# Patient Record
Sex: Female | Born: 1985
Health system: Southern US, Community
[De-identification: ages and names within clinical notes are randomized; demographics above are authoritative.]

## PROBLEM LIST (undated history)

## (undated) ENCOUNTER — Inpatient Hospital Stay (HOSPITAL_COMMUNITY): Payer: Self-pay

---

## 2012-08-07 ENCOUNTER — Encounter (HOSPITAL_BASED_OUTPATIENT_CLINIC_OR_DEPARTMENT_OTHER): Payer: Self-pay | Admitting: Emergency Medicine

## 2012-08-07 ENCOUNTER — Emergency Department (HOSPITAL_BASED_OUTPATIENT_CLINIC_OR_DEPARTMENT_OTHER)
Admission: EM | Admit: 2012-08-07 | Discharge: 2012-08-07 | Disposition: A | Discharge status: Home / Self Care | Payer: Medicaid Other | Attending: Emergency Medicine | Admitting: Emergency Medicine

## 2012-08-07 DIAGNOSIS — R42 Dizziness and giddiness: Secondary | ICD-10-CM | POA: Insufficient documentation

## 2012-08-07 DIAGNOSIS — N390 Urinary tract infection, site not specified: Secondary | ICD-10-CM | POA: Insufficient documentation

## 2012-08-07 DIAGNOSIS — F17200 Nicotine dependence, unspecified, uncomplicated: Secondary | ICD-10-CM | POA: Insufficient documentation

## 2012-08-07 LAB — URINALYSIS, ROUTINE W REFLEX MICROSCOPIC
Glucose, UA: NEGATIVE mg/dL
Hgb urine dipstick: NEGATIVE
Ketones, ur: NEGATIVE mg/dL
Protein, ur: 30 mg/dL — AB
Urobilinogen, UA: 1 mg/dL (ref 0.0–1.0)

## 2012-08-07 LAB — URINE MICROSCOPIC-ADD ON

## 2012-08-07 MED ORDER — HYDROCODONE-ACETAMINOPHEN 5-500 MG PO TABS: 1.0000 | ORAL_TABLET | ORAL | Status: DC | PRN

## 2012-08-07 MED ORDER — NITROFURANTOIN MONOHYD MACRO 100 MG PO CAPS
100.0000 mg | ORAL_CAPSULE | Freq: Once | ORAL | Status: AC
  Administered 2012-08-07: 100 mg via ORAL
  Filled 2012-08-07: qty 1

## 2012-08-07 MED ORDER — NITROFURANTOIN MONOHYD MACRO 100 MG PO CAPS: 100.0000 mg | ORAL_CAPSULE | Freq: Two times a day (BID) | ORAL | Status: DC

## 2012-08-07 MED ORDER — HYDROCODONE-ACETAMINOPHEN 5-325 MG PO TABS
2.0000 | ORAL_TABLET | Freq: Once | ORAL | Status: AC
  Administered 2012-08-07: 2 via ORAL
  Filled 2012-08-07: qty 2

## 2012-08-07 MED ORDER — IBUPROFEN 800 MG PO TABS
800.0000 mg | ORAL_TABLET | Freq: Once | ORAL | Status: AC
  Administered 2012-08-07: 800 mg via ORAL
  Filled 2012-08-07: qty 1

## 2012-08-07 MED ORDER — ONDANSETRON HCL 8 MG PO TABS
4.0000 mg | ORAL_TABLET | Freq: Once | ORAL | Status: AC
  Administered 2012-08-07: 4 mg via ORAL
  Filled 2012-08-07: qty 1

## 2012-08-07 NOTE — ED Provider Notes (Signed)
History     CSN: [id number]  Arrival date & time 08/07/12  4098   First MD Initiated Contact with Patient 08/07/12 2036      Chief Complaint  Patient presents with  . Abdominal Pain  . Dysuria    (Consider location/radiation/quality/duration/timing/severity/associated sxs/prior treatment) Patient is a 26 y.o. female presenting with abdominal pain and dysuria. The history is provided by the patient.  Abdominal Pain The primary symptoms of the illness include abdominal pain and dysuria. The primary symptoms of the illness do not include shortness of breath. The current episode started 2 days ago. The onset of the illness was gradual. The problem has been gradually worsening.  The dysuria is not associated with hematuria or frequency.  The patient states that she believes she is currently not pregnant. Symptoms associated with the illness do not include chills, hematuria, frequency or back pain. Associated medical issues comments: dizziness.  Dysuria  Pertinent negatives include no chills, no frequency and no hematuria.    History reviewed. No pertinent past medical history.  History reviewed. No pertinent past surgical history.  No family history on file.  History  Substance Use Topics  . Smoking status: Current Every Day Smoker -- 0.2 packs/day  . Smokeless tobacco: Not on file  . Alcohol Use: No    OB History    Grav Para Term Preterm Abortions TAB SAB Ect Mult Living                  Review of Systems  Constitutional: Negative for chills and activity change.       All ROS Neg except as noted in HPI  HENT: Negative for nosebleeds and neck pain.   Eyes: Negative for photophobia and discharge.  Respiratory: Negative for cough, shortness of breath and wheezing.   Cardiovascular: Negative for chest pain and palpitations.  Gastrointestinal: Positive for abdominal pain. Negative for blood in stool.  Genitourinary: Positive for dysuria. Negative for frequency and  hematuria.  Musculoskeletal: Negative for back pain and arthralgias.  Skin: Negative.   Neurological: Positive for dizziness. Negative for seizures and speech difficulty.  Psychiatric/Behavioral: Negative for hallucinations and confusion.    Allergies  Review of patient's allergies indicates no known allergies.  Home Medications  No current outpatient prescriptions on file.  BP 131/79  Pulse 92  Temp 97.9 F (36.6 C) (Oral)  Resp 20  Ht 5' (1.524 m)  Wt 115 lb (52.164 kg)  BMI 22.46 kg/m2  SpO2 100%  LMP 07/12/2012  Physical Exam  Nursing note and vitals reviewed. Constitutional: She is oriented to person, place, and time. She appears well-developed and well-nourished.  Non-toxic appearance.  HENT:  Head: Normocephalic.  Right Ear: Tympanic membrane and external ear normal.  Left Ear: Tympanic membrane and external ear normal.  Eyes: EOM and lids are normal. Pupils are equal, round, and reactive to light.  Neck: Normal range of motion. Neck supple. Carotid bruit is not present.  Cardiovascular: Normal rate, regular rhythm, normal heart sounds, intact distal pulses and normal pulses.   Pulmonary/Chest: Breath sounds normal. No respiratory distress.  Abdominal: Soft. Bowel sounds are normal. There is no guarding.       Lower abd pain to palpation. No CVAT.   Musculoskeletal: Normal range of motion.  Lymphadenopathy:       Head (right side): No submandibular adenopathy present.       Head (left side): No submandibular adenopathy present.    She has no cervical adenopathy.  Neurological: She  is alert and oriented to person, place, and time. She has normal strength. No cranial nerve deficit or sensory deficit.  Skin: Skin is warm and dry.  Psychiatric: She has a normal mood and affect. Her speech is normal.    ED Course  Procedures (including critical care time)  Labs Reviewed  URINALYSIS, ROUTINE W REFLEX MICROSCOPIC - Abnormal; Notable for the following:    APPearance  CLOUDY (*)     Protein, ur 30 (*)     Nitrite POSITIVE (*)     Leukocytes, UA LARGE (*)     All other components within normal limits  URINE MICROSCOPIC-ADD ON - Abnormal; Notable for the following:    Squamous Epithelial / LPF FEW (*)     Bacteria, UA MANY (*)     All other components within normal limits  PREGNANCY, URINE   No results found.   No diagnosis found.    MDM  I have reviewed nursing notes, vital signs, and all appropriate lab and imaging results for this patient. Urine pregnancy test is negative. Urine analysis reveals too many to count white blood cells, many bacteria, positive nitrates, positive leukocyte esterase. Culture sent to the lab. Patient placed on Macrobid 2 times daily. Prescription for Norco one every 4 hours given to the patient. Your in the wounds to the patient. Patient is to see her primary physician concerning a discharge that she has had for some time. Patient advised to return to the emergency department if any changes, problems, or concerns.       Kathie Dike, [place] 08/07/12 2117

## 2012-08-07 NOTE — ED Notes (Addendum)
Suprapubic pain and dysuria x2 days. Vaginal discharge clear/yellow.

## 2012-08-07 NOTE — ED Provider Notes (Signed)
Medical screening examination/treatment/procedure(s) were performed by non-physician practitioner and as supervising physician I was immediately available for consultation/collaboration.   Celene Kras, MD 08/07/12 2118

## 2012-08-09 LAB — URINE CULTURE: Colony Count: >=100000

## 2012-08-10 NOTE — ED Notes (Signed)
+   Urine Patient treated with macrobid-sensitive to same-chart appended per protocol MD. 

## 2014-04-21 ENCOUNTER — Emergency Department (HOSPITAL_COMMUNITY)
Admission: AD | Admit: 2014-04-21 | Discharge: 2014-04-21 | Disposition: A | Discharge status: Home / Self Care | Payer: Medicaid Other | Source: Ambulatory Visit | Attending: Obstetrics & Gynecology | Admitting: Obstetrics & Gynecology

## 2014-04-21 ENCOUNTER — Encounter (HOSPITAL_COMMUNITY): Payer: Self-pay | Admitting: Emergency Medicine

## 2014-04-21 ENCOUNTER — Encounter (HOSPITAL_COMMUNITY): Payer: Self-pay | Admitting: *Deleted

## 2014-04-21 ENCOUNTER — Inpatient Hospital Stay (HOSPITAL_COMMUNITY): Payer: Medicaid Other

## 2014-04-21 ENCOUNTER — Inpatient Hospital Stay (EMERGENCY_DEPARTMENT_HOSPITAL)
Admission: AD | Admit: 2014-04-21 | Discharge: 2014-04-21 | Disposition: A | Discharge status: Home / Self Care | Payer: Medicaid Other | Source: Ambulatory Visit | Attending: Obstetrics & Gynecology | Admitting: Obstetrics & Gynecology

## 2014-04-21 DIAGNOSIS — R109 Unspecified abdominal pain: Secondary | ICD-10-CM | POA: Insufficient documentation

## 2014-04-21 DIAGNOSIS — O209 Hemorrhage in early pregnancy, unspecified: Secondary | ICD-10-CM

## 2014-04-21 DIAGNOSIS — F17200 Nicotine dependence, unspecified, uncomplicated: Secondary | ICD-10-CM | POA: Insufficient documentation

## 2014-04-21 DIAGNOSIS — Z3200 Encounter for pregnancy test, result unknown: Secondary | ICD-10-CM | POA: Insufficient documentation

## 2014-04-21 DIAGNOSIS — O2341 Unspecified infection of urinary tract in pregnancy, first trimester: Secondary | ICD-10-CM

## 2014-04-21 DIAGNOSIS — N898 Other specified noninflammatory disorders of vagina: Secondary | ICD-10-CM | POA: Insufficient documentation

## 2014-04-21 LAB — URINALYSIS, ROUTINE W REFLEX MICROSCOPIC
Bilirubin Urine: NEGATIVE
Glucose, UA: NEGATIVE mg/dL
Ketones, ur: NEGATIVE mg/dL
LEUKOCYTES UA: NEGATIVE
NITRITE: POSITIVE — AB
PH: 6 (ref 5.0–8.0)
Protein, ur: NEGATIVE mg/dL
SPECIFIC GRAVITY, URINE: 1.02 (ref 1.005–1.030)
Urobilinogen, UA: 0.2 mg/dL (ref 0.0–1.0)

## 2014-04-21 LAB — URINE MICROSCOPIC-ADD ON

## 2014-04-21 LAB — CBC
HCT: 34 % — ABNORMAL LOW (ref 36.0–46.0)
HEMOGLOBIN: 11.6 g/dL — AB (ref 12.0–15.0)
MCH: 31.5 pg (ref 26.0–34.0)
MCHC: 34.1 g/dL (ref 30.0–36.0)
MCV: 92.4 fL (ref 78.0–100.0)
Platelets: 173 10*3/uL (ref 150–400)
RBC: 3.68 MIL/uL — AB (ref 3.87–5.11)
RDW: 12.4 % (ref 11.5–15.5)
WBC: 7.7 10*3/uL (ref 4.0–10.5)

## 2014-04-21 LAB — WET PREP, GENITAL
TRICH WET PREP: NONE SEEN
Yeast Wet Prep HPF POC: NONE SEEN

## 2014-04-21 LAB — ABO/RH: ABO/RH(D): O POS

## 2014-04-21 LAB — HCG, QUANTITATIVE, PREGNANCY: HCG, BETA CHAIN, QUANT, S: 258 m[IU]/mL — AB (ref <5)

## 2014-04-21 LAB — POCT PREGNANCY, URINE: Preg Test, Ur: POSITIVE — AB

## 2014-04-21 MED ORDER — SULFAMETHOXAZOLE-TMP DS 800-160 MG PO TABS: 1.0000 | ORAL_TABLET | Freq: Two times a day (BID) | ORAL | Status: DC

## 2014-04-21 NOTE — MAU Provider Note (Signed)
History     CSN: [id number]  Arrival date and time: 04/21/14 1738   First Provider Initiated Contact with Patient 04/21/14 1908      Chief Complaint  Patient presents with  . Possible Pregnancy  . Abdominal Cramping  . Vaginal Bleeding   HPI Tanya Perez is a 28 y.o. G3P1001 at [redacted]w[redacted]d by unsure LMP who presents to MAU today with complaint of heavy bleeding since last night. The patient is also having cramping in the lower abdomen rated at 8/10. She denies vaginal discharge, UTI symptoms, fever, N/V/D or constipation. She states that she has used ~ 4 pads today. She denies weakness or dizziness.   OB History   Grav Para Term Preterm Abortions TAB SAB Ect Mult Living   3 1 1       1       History reviewed. No pertinent past medical history.  History reviewed. No pertinent past surgical history.  History reviewed. No pertinent family history.  History  Substance Use Topics  . Smoking status: Current Every Day Smoker -- 0.20 packs/day  . Smokeless tobacco: Never Used  . Alcohol Use: No    Allergies: No Known Allergies  No prescriptions prior to admission    Review of Systems  Constitutional: Negative for fever and malaise/fatigue.  Gastrointestinal: Positive for abdominal pain. Negative for nausea, vomiting, diarrhea and constipation.  Genitourinary: Negative for dysuria, urgency and frequency.       + vaginal bleeding Neg - vaginal discharge  Neurological: Negative for dizziness, loss of consciousness and weakness.   Physical Exam   Blood pressure 115/54, pulse 76, temperature 99.1 F (37.3 C), temperature source Oral, resp. rate 16, height 5' (1.524 m), weight 116 lb 6.4 oz (52.799 kg), last menstrual period 03/05/2014, SpO2 99.00%.  Physical Exam  Constitutional: She is oriented to person, place, and time. She appears well-developed and well-nourished. No distress.  HENT:  Head: Normocephalic and atraumatic.  Cardiovascular: Normal rate.   Respiratory:  Effort normal.  GI: Soft. Bowel sounds are normal. She exhibits no distension and no mass. There is tenderness (mild to moderate tenderness to palpation of the lower abdomen at midline). There is guarding. There is no rebound.  Genitourinary: Uterus is enlarged (mild). Uterus is not tender. Cervix exhibits no motion tenderness, no discharge and no friability. Right adnexum displays tenderness. Right adnexum displays no mass. Left adnexum displays tenderness. Left adnexum displays no mass. There is bleeding (small amount of blood in the vaginal vault) around the vagina. No vaginal discharge found.  Cervix: closed, thick  Neurological: She is alert and oriented to person, place, and time.  Skin: Skin is warm and dry. No erythema.  Psychiatric: She has a normal mood and affect.   Results for orders placed during the hospital encounter of 04/21/14 (from the past 24 hour(s))  CBC     Status: Abnormal   Collection Time    04/21/14  6:13 PM      Result Value Ref Range   WBC 7.7  4.0 - 10.5 K/uL   RBC 3.68 (*) 3.87 - 5.11 MIL/uL   Hemoglobin 11.6 (*) 12.0 - 15.0 g/dL   HCT 78.2 (*) 95.6 - 21.3 %   MCV 92.4  78.0 - 100.0 fL   MCH 31.5  26.0 - 34.0 pg   MCHC 34.1  30.0 - 36.0 g/dL   RDW 08.6  57.8 - 46.9 %   Platelets 173  150 - 400 K/uL  ABO/RH  Status: None   Collection Time    04/21/14  6:13 PM      Result Value Ref Range   ABO/RH(D) O POS    HCG, QUANTITATIVE, PREGNANCY     Status: Abnormal   Collection Time    04/21/14  6:13 PM      Result Value Ref Range   hCG, Beta Chain, Quant, S 258 (*) <5 mIU/mL  URINALYSIS, ROUTINE W REFLEX MICROSCOPIC     Status: Abnormal   Collection Time    04/21/14  6:19 PM      Result Value Ref Range   Color, Urine YELLOW  YELLOW   APPearance CLEAR  CLEAR   Specific Gravity, Urine 1.020  1.005 - 1.030   pH 6.0  5.0 - 8.0   Glucose, UA NEGATIVE  NEGATIVE mg/dL   Hgb urine dipstick LARGE (*) NEGATIVE   Bilirubin Urine NEGATIVE  NEGATIVE   Ketones, ur  NEGATIVE  NEGATIVE mg/dL   Protein, ur NEGATIVE  NEGATIVE mg/dL   Urobilinogen, UA 0.2  0.0 - 1.0 mg/dL   Nitrite POSITIVE (*) NEGATIVE   Leukocytes, UA NEGATIVE  NEGATIVE  URINE MICROSCOPIC-ADD ON     Status: Abnormal   Collection Time    04/21/14  6:19 PM      Result Value Ref Range   Squamous Epithelial / LPF FEW (*) RARE   WBC, UA 3-6  <3 WBC/hpf   RBC / HPF 0-2  <3 RBC/hpf   Bacteria, UA MANY (*) RARE  POCT PREGNANCY, URINE     Status: Abnormal   Collection Time    04/21/14  6:21 PM      Result Value Ref Range   Preg Test, Ur POSITIVE (*) NEGATIVE  WET PREP, GENITAL     Status: Abnormal   Collection Time    04/21/14  7:18 PM      Result Value Ref Range   Yeast Wet Prep HPF POC NONE SEEN  NONE SEEN   Trich, Wet Prep NONE SEEN  NONE SEEN   Clue Cells Wet Prep HPF POC FEW (*) NONE SEEN   WBC, Wet Prep HPF POC FEW (*) NONE SEEN    MAU Course  Procedures None  MDM +UPT UA, wet prep, GC/chlamydia, CBC, ABO/Rh, quant hCG and US today 2000 - patient in US. Care turned over to Wynelle BourgeoisMarie Williams, CNM  Freddi StarrJulie N Ethier, PA-C  04/21/2014, 7:56 PM  Assessment and Plan   US Ob Transvaginal  04/21/2014   CLINICAL DATA:  Positive pregnancy test, pelvic pain, vaginal bleeding  EXAM: OBSTETRIC <14 WK US AND TRANSVAGINAL OB US  TECHNIQUE: Both transabdominal and transvaginal ultrasound examinations were performed for complete evaluation of the gestation as well as the maternal uterus, adnexal regions, and pelvic cul-de-sac. Transvaginal technique was performed to assess early pregnancy.  COMPARISON:  None.    FINDINGS: Intrauterine gestational sac: Visualized, mildly irregular in shape, within the lower uterine segment endometrial canal  Yolk sac:  Not visualized  Embryo:  Not visualized  Cardiac Activity: No embryo seen  MSD: 5 mm   5 w   0  d        US EDC: 12/22/2014  Maternal uterus/adnexae: The right ovary is normal. Within the left adnexa/ovary, there is a hypoechoic oval structure with low  level internal echoes measuring 3.4 x 3.3 x 2.6 cm  .  IMPRESSION: Intrauterine gestational sac, within the lower uterine segment endometrial canal, suspicious for abortion in progress given the presence of  vaginal bleeding at the time of the exam. No yolk sac, fetal pole, or cardiac activity yet visualized. Depending on the clinical symptoms, consider imaging followup in 10- 14 days to document appropriate pregnancy progression.  Hypoechoic left ovarian mass with an appearance most typical for an endometrioma. Hemorrhagic cyst may appear similar.   Followup pelvic ultrasound is recommended in 4-6 weeks for further evaluation.     Electronically Signed   By: Christiana PellantGretchen  Green M.D.   On: 04/21/2014 20:39   Discussed findings with pt.  Was planning an elective AB this week Will repeat Quant in 2 days in clinic. May repeat US in 7-10 days. Aviva SignsMarie L Williams, CNM

## 2014-04-21 NOTE — ED Notes (Signed)
Pt c/o abd cramps x 1 day and states she started bleeding last night. Pt states she took a pregnancy test and it was positive.

## 2014-04-21 NOTE — MAU Note (Signed)
Patient states she has had a positive home pregnancy test. States she has been bleeding and having abdominal cramping. Denies nausea or vomiting.

## 2014-04-21 NOTE — Discharge Instructions (Signed)

## 2014-04-21 NOTE — ED Notes (Signed)
Pt left w/o notifying staff.  Department Secretary received a call that [facility] is [facility].

## 2014-04-21 NOTE — MAU Provider Note (Signed)
Attestation of Attending Supervision of Advanced Practitioner (CNM/NP): Evaluation and management procedures were performed by the Advanced Practitioner under my supervision and collaboration.  I have reviewed the Advanced Practitioner's note and chart, and I agree with the management and plan.  HARRAWAY-SMITH, Kaleena Corrow 8:50 PM

## 2014-04-21 NOTE — ED Notes (Addendum)
Called pt x2 no answer

## 2014-04-22 LAB — GC/CHLAMYDIA PROBE AMP
CT PROBE, AMP APTIMA: NEGATIVE
GC PROBE AMP APTIMA: NEGATIVE

## 2014-04-23 ENCOUNTER — Other Ambulatory Visit: Payer: Medicaid Other

## 2014-04-23 ENCOUNTER — Telehealth: Payer: Self-pay | Admitting: *Deleted

## 2014-04-23 DIAGNOSIS — N939 Abnormal uterine and vaginal bleeding, unspecified: Secondary | ICD-10-CM

## 2014-04-23 LAB — HCG, QUANTITATIVE, PREGNANCY: hCG, Beta Chain, Quant, S: 205.7 m[IU]/mL

## 2014-04-23 NOTE — Telephone Encounter (Signed)
Talked with patient concerning results and scheduling an appointment next Wednesday for BHCG.  Educated patient on sign/symptoms to come to MAU.  Pt verbalizes understanding.

## 2014-04-23 NOTE — Telephone Encounter (Signed)
Tanya Perez called to get the results of her bhcg drawn this am. Called her and informed her results are not yet available- we will call her once we receive the results.

## 2014-04-30 ENCOUNTER — Other Ambulatory Visit: Payer: Medicaid Other

## 2014-05-05 ENCOUNTER — Inpatient Hospital Stay (HOSPITAL_COMMUNITY)
Admission: AD | Admit: 2014-05-05 | Discharge: 2014-05-05 | Disposition: A | Discharge status: Home / Self Care | Payer: Medicaid Other | Source: Ambulatory Visit | Attending: Obstetrics & Gynecology | Admitting: Obstetrics & Gynecology

## 2014-05-05 DIAGNOSIS — O039 Complete or unspecified spontaneous abortion without complication: Secondary | ICD-10-CM

## 2014-05-05 DIAGNOSIS — O035 Genital tract and pelvic infection following complete or unspecified spontaneous abortion: Secondary | ICD-10-CM | POA: Insufficient documentation

## 2014-05-05 DIAGNOSIS — N390 Urinary tract infection, site not specified: Secondary | ICD-10-CM

## 2014-05-05 LAB — URINALYSIS, ROUTINE W REFLEX MICROSCOPIC
Bilirubin Urine: NEGATIVE
Glucose, UA: NEGATIVE mg/dL
HGB URINE DIPSTICK: NEGATIVE
KETONES UR: NEGATIVE mg/dL
Leukocytes, UA: NEGATIVE
Nitrite: POSITIVE — AB
PROTEIN: NEGATIVE mg/dL
Specific Gravity, Urine: 1.02 (ref 1.005–1.030)
UROBILINOGEN UA: 1 mg/dL (ref 0.0–1.0)
pH: 7 (ref 5.0–8.0)

## 2014-05-05 LAB — URINE MICROSCOPIC-ADD ON

## 2014-05-05 LAB — HCG, QUANTITATIVE, PREGNANCY: HCG, BETA CHAIN, QUANT, S: 1 m[IU]/mL (ref <5)

## 2014-05-05 NOTE — MAU Provider Note (Signed)
History   Chief Complaint:  Follow-up   Tanya Perez is  28 y.o. G3P1001 Patient's last menstrual period was 03/05/2014.Marland Kitchen. Patient is here for follow up of quantitative HCG and ongoing surveillance of pregnancy status.   She is 5824w5d weeks gestation  by LMP w/ dropping quants. IUP not verified.   Missed clinic appointment to check quant. Denied pain and bleeding. Bleedin gstopped 1 week ago. Did not take ABX for UTI. No Sx.   Since her last visit, the patient is without new complaint.   The patient reports bleeding as  none now.   General ROS:  negative  Her previous Quantitative HCG values are: Results for Tanya LynchLANE, Lacrisha (MRN [id number]) as of 05/06/2014 15:03  Ref. Range 04/21/2014 18:13 04/23/2014 11:15  hCG, Beta Chain, Quant, S Latest Range: <5 mIU/mL 258 (H) 205.7     Physical Exam   Blood pressure 105/72, pulse 85, temperature 98.5 F (36.9 C), temperature source Oral, resp. rate 16, last menstrual period 03/05/2014.  Focused Gynecological Exam: examination not indicated  Labs: Results for orders placed during the hospital encounter of 05/05/14 (from the past 48 hour(s))  URINALYSIS, ROUTINE W REFLEX MICROSCOPIC     Status: Abnormal   Collection Time    05/05/14  1:34 PM      Result Value Ref Range   Color, Urine YELLOW  YELLOW   APPearance HAZY (*) CLEAR   Specific Gravity, Urine 1.020  1.005 - 1.030   pH 7.0  5.0 - 8.0   Glucose, UA NEGATIVE  NEGATIVE mg/dL   Hgb urine dipstick NEGATIVE  NEGATIVE   Bilirubin Urine NEGATIVE  NEGATIVE   Ketones, ur NEGATIVE  NEGATIVE mg/dL   Protein, ur NEGATIVE  NEGATIVE mg/dL   Urobilinogen, UA 1.0  0.0 - 1.0 mg/dL   Nitrite POSITIVE (*) NEGATIVE   Leukocytes, UA NEGATIVE  NEGATIVE  HCG, QUANTITATIVE, PREGNANCY     Status: None   Collection Time    05/05/14  1:34 PM      Result Value Ref Range   hCG, Beta Chain, Quant, S 1  <5 mIU/mL   Comment:              GEST. AGE      CONC.  (mIU/mL)       <=1 WEEK        5 - 50         2 WEEKS        50 - 500         3 WEEKS       100 - 10,000         4 WEEKS     1,000 - 30,000         5 WEEKS     3,500 - 115,000       6-8 WEEKS     12,000 - 270,000        12 WEEKS     15,000 - 220,000                FEMALE AND NON-PREGNANT FEMALE:         LESS THAN 5 mIU/mL  URINE MICROSCOPIC-ADD ON     Status: Abnormal   Collection Time    05/05/14  1:34 PM      Result Value Ref Range   Squamous Epithelial / LPF MANY (*) RARE   WBC, UA 3-6  <3 WBC/hpf   RBC / HPF 0-2  <3 RBC/hpf   Bacteria, UA  MANY (*) RARE   Urine-Other MUCOUS PRESENT      Ultrasound Studies:   US Ob Comp Less 14 Wks  04/21/2014   CLINICAL DATA:  Positive pregnancy test, pelvic pain, vaginal bleeding  EXAM: OBSTETRIC <14 WK [place] AND TRANSVAGINAL OB US  TECHNIQUE: Both transabdominal and transvaginal ultrasound examinations were performed for complete evaluation of the gestation as well as the maternal uterus, adnexal regions, and pelvic cul-de-sac. Transvaginal technique was performed to assess early pregnancy.  COMPARISON:  None.  FINDINGS: Intrauterine gestational sac: Visualized, mildly irregular in shape, within the lower uterine segment endometrial canal  Yolk sac:  Not visualized  Embryo:  Not visualized  Cardiac Activity: No embryo seen  MSD: 5 mm   5 w   0  d        [place] EDC: 12/22/2014  Maternal uterus/adnexae: The right ovary is normal. Within the left adnexa/ovary, there is a hypoechoic oval structure with low level internal echoes measuring 3.4 x 3.3 x 2.6 cm.  IMPRESSION: Intrauterine gestational sac, within the lower uterine segment endometrial canal, suspicious for abortion in progress given the presence of vaginal bleeding at the time of the exam. No yolk sac, fetal pole, or cardiac activity yet visualized. Depending on the clinical symptoms, consider imaging followup in 10- 14 days to document appropriate pregnancy progression.  Hypoechoic left ovarian mass with an appearance most typical for an endometrioma. Hemorrhagic  cyst may appear similar. Followup pelvic ultrasound is recommended in 4-6 weeks for further evaluation.   Electronically Signed   By: Christiana Pellant M.D.   On: 04/21/2014 20:39   US Ob Transvaginal  04/21/2014   CLINICAL DATA:  Positive pregnancy test, pelvic pain, vaginal bleeding  EXAM: OBSTETRIC <14 WK [place] AND TRANSVAGINAL OB US  TECHNIQUE: Both transabdominal and transvaginal ultrasound examinations were performed for complete evaluation of the gestation as well as the maternal uterus, adnexal regions, and pelvic cul-de-sac. Transvaginal technique was performed to assess early pregnancy.  COMPARISON:  None.  FINDINGS: Intrauterine gestational sac: Visualized, mildly irregular in shape, within the lower uterine segment endometrial canal  Yolk sac:  Not visualized  Embryo:  Not visualized  Cardiac Activity: No embryo seen  MSD: 5 mm   5 w   0  d        [place] EDC: 12/22/2014  Maternal uterus/adnexae: The right ovary is normal. Within the left adnexa/ovary, there is a hypoechoic oval structure with low level internal echoes measuring 3.4 x 3.3 x 2.6 cm.  IMPRESSION: Intrauterine gestational sac, within the lower uterine segment endometrial canal, suspicious for abortion in progress given the presence of vaginal bleeding at the time of the exam. No yolk sac, fetal pole, or cardiac activity yet visualized. Depending on the clinical symptoms, consider imaging followup in 10- 14 days to document appropriate pregnancy progression.  Hypoechoic left ovarian mass with an appearance most typical for an endometrioma. Hemorrhagic cyst may appear similar. Followup pelvic ultrasound is recommended in 4-6 weeks for further evaluation.   Electronically Signed   By: Christiana Pellant M.D.   On: 04/21/2014 20:39    Assessment: The primary encounter diagnosis was Miscarriage. A diagnosis of Urinary tract infection without hematuria, site unspecified was also pertinent to this visit.  Plan: D/C home  Pt requested to leave prior to  results.   Medication List         sulfamethoxazole-trimethoprim 800-160 MG per tablet  Commonly known as:  BACTRIM DS  Take 1 tablet by mouth  2 (two) times daily.       Follow-up Information   Follow up with Ob/Gyn On 05/13/2014. (as scheduled)      Noorah Giammona 05/05/2014, 1:36 PM

## 2014-05-05 NOTE — Discharge Instructions (Signed)
Miscarriage A miscarriage is the sudden loss of an unborn baby (fetus) before the 20th week of pregnancy. Most miscarriages happen in the first 3 months of pregnancy. Sometimes, it happens before a woman even knows she is pregnant. A miscarriage is also called a "spontaneous miscarriage" or "early pregnancy loss." Having a miscarriage can be an emotional experience. Talk with your caregiver about any questions you may have about miscarrying, the grieving process, and your future pregnancy plans. CAUSES   Problems with the fetal chromosomes that make it impossible for the baby to develop normally. Problems with the baby's genes or chromosomes are most often the result of errors that occur, by chance, as the embryo divides and grows. The problems are not inherited from the parents.  Infection of the cervix or uterus.   Hormone problems.   Problems with the cervix, such as having an incompetent cervix. This is when the tissue in the cervix is not strong enough to hold the pregnancy.   Problems with the uterus, such as an abnormally shaped uterus, uterine fibroids, or congenital abnormalities.   Certain medical conditions.   Smoking, drinking alcohol, or taking illegal drugs.   Trauma.  Often, the cause of a miscarriage is unknown.  SYMPTOMS   Vaginal bleeding or spotting, with or without cramps or pain.  Pain or cramping in the abdomen or lower back.  Passing fluid, tissue, or blood clots from the vagina. DIAGNOSIS  Your caregiver will perform a physical exam. You may also have an ultrasound to confirm the miscarriage. Blood or urine tests may also be ordered. TREATMENT   Sometimes, treatment is not necessary if you naturally pass all the fetal tissue that was in the uterus. If some of the fetus or placenta remains in the body (incomplete miscarriage), tissue left behind may become infected and must be removed. Usually, a dilation and curettage (D and C) procedure is performed.  During a D and C procedure, the cervix is widened (dilated) and any remaining fetal or placental tissue is gently removed from the uterus.  Antibiotic medicines are prescribed if there is an infection. Other medicines may be given to reduce the size of the uterus (contract) if there is a lot of bleeding.  If you have Rh negative blood and your baby was Rh positive, you will need a Rh immunoglobulin shot. This shot will protect any future baby from having Rh blood problems in future pregnancies. HOME CARE INSTRUCTIONS   Your caregiver may order bed rest or may allow you to continue light activity. Resume activity as directed by your caregiver.  Have someone help with home and family responsibilities during this time.   Keep track of the number of sanitary pads you use each day and how soaked (saturated) they are. Write down this information.   Do not use tampons. Do not douche or have sexual intercourse until approved by your caregiver.   Only take over-the-counter or prescription medicines for pain or discomfort as directed by your caregiver.   Do not take aspirin. Aspirin can cause bleeding.   Keep all follow-up appointments with your caregiver.   If you or your partner have problems with grieving, talk to your caregiver or seek counseling to help cope with the pregnancy loss. Allow enough time to grieve before trying to get pregnant again.  SEEK IMMEDIATE MEDICAL CARE IF:   You have severe cramps or pain in your back or abdomen.  You have a fever.  You pass large blood clots (walnut-sized   or larger) ortissue from your vagina. Save any tissue for your caregiver to inspect.   Your bleeding increases.   You have a thick, bad-smelling vaginal discharge.  You become lightheaded, weak, or you faint.   You have chills.  MAKE SURE YOU:  Understand these instructions.  Will watch your condition.  Will get help right away if you are not doing well or get  worse. Document Released: 03/29/2001 Document Revised: 01/28/2013 Document Reviewed: 11/22/2011 [employer] Patient Information 2015 [employer]. This information is not intended to replace advice given to you by your health care provider. Make sure you discuss any questions you have with your health care provider.  

## 2014-05-05 NOTE — MAU Note (Signed)
Missed appt due to job. Feeling good. Stopped bleeding last wk. No pain.

## 2014-05-05 NOTE — MAU Note (Signed)
Was told she had a UTI, never got prescription, denies any symptoms.

## 2014-05-07 ENCOUNTER — Telehealth: Payer: Self-pay

## 2014-05-07 NOTE — MAU Provider Note (Signed)
Attestation of Attending Supervision of Advanced Practitioner (CNM/NP): Evaluation and management procedures were performed by the Advanced Practitioner under my supervision and collaboration.  I have reviewed the Advanced Practitioner's note and chart, and I agree with the management and plan.  HARRAWAY-SMITH, Notnamed Scholz 9:00 AM

## 2014-05-07 NOTE — Telephone Encounter (Signed)
Message copied by Louanna RawAMPBELL, Analaya Hoey M on Wed May 07, 2014  8:16 AM ------      Message from: Dorathy KinsmanSMITH, [place]      Created: Tue May 06, 2014  6:52 PM      Regarding: Results       Please inform pt of quant >1. Completed SAB. No need for repeat quants. Ke[facility]. Needs to take ABX fir UTI that was Rx'd last MAU visit.  ------

## 2014-05-07 NOTE — Telephone Encounter (Signed)
Attempted to call patient. No answer. Left message stating we are calling with results, please call clinic.  

## 2014-05-08 ENCOUNTER — Telehealth: Payer: Self-pay | Admitting: General Practice

## 2014-05-08 NOTE — Telephone Encounter (Signed)
Opened in error

## 2014-05-08 NOTE — Telephone Encounter (Signed)
Spoke with pts FOB and was informed to call back at 1530 to contact pt.  I informed him that I would call around that time.

## 2014-05-08 NOTE — Telephone Encounter (Signed)
Called pt and informed her that she does not need to come in for any more labs draws and that [facility] [address]  Pt stated understanding and with no further questions.

## 2014-08-18 ENCOUNTER — Encounter (HOSPITAL_COMMUNITY): Payer: Self-pay | Admitting: *Deleted

## 2014-09-19 ENCOUNTER — Encounter (HOSPITAL_COMMUNITY): Payer: Self-pay | Admitting: *Deleted

## 2014-09-19 ENCOUNTER — Inpatient Hospital Stay (HOSPITAL_COMMUNITY)
Admission: AD | Admit: 2014-09-19 | Discharge: 2014-09-19 | Disposition: A | Discharge status: Home / Self Care | Payer: Medicaid Other | Source: Ambulatory Visit | Attending: Obstetrics and Gynecology | Admitting: Obstetrics and Gynecology

## 2014-09-19 DIAGNOSIS — O210 Mild hyperemesis gravidarum: Secondary | ICD-10-CM | POA: Diagnosis not present

## 2014-09-19 DIAGNOSIS — Z3201 Encounter for pregnancy test, result positive: Secondary | ICD-10-CM

## 2014-09-19 DIAGNOSIS — Z3A01 Less than 8 weeks gestation of pregnancy: Secondary | ICD-10-CM | POA: Diagnosis not present

## 2014-09-19 DIAGNOSIS — O99331 Smoking (tobacco) complicating pregnancy, first trimester: Secondary | ICD-10-CM | POA: Insufficient documentation

## 2014-09-19 DIAGNOSIS — O219 Vomiting of pregnancy, unspecified: Secondary | ICD-10-CM

## 2014-09-19 DIAGNOSIS — F17210 Nicotine dependence, cigarettes, uncomplicated: Secondary | ICD-10-CM | POA: Diagnosis not present

## 2014-09-19 DIAGNOSIS — R109 Unspecified abdominal pain: Secondary | ICD-10-CM | POA: Diagnosis present

## 2014-09-19 LAB — URINALYSIS, ROUTINE W REFLEX MICROSCOPIC
Bilirubin Urine: NEGATIVE
GLUCOSE, UA: NEGATIVE mg/dL
KETONES UR: NEGATIVE mg/dL
Nitrite: NEGATIVE
PH: 6 (ref 5.0–8.0)
Protein, ur: 30 mg/dL — AB
Urobilinogen, UA: 0.2 mg/dL (ref 0.0–1.0)

## 2014-09-19 LAB — URINE MICROSCOPIC-ADD ON

## 2014-09-19 LAB — POCT PREGNANCY, URINE: PREG TEST UR: POSITIVE — AB

## 2014-09-19 MED ORDER — PROMETHAZINE HCL 12.5 MG PO TABS: 12.5000 mg | ORAL_TABLET | Freq: Four times a day (QID) | ORAL | Status: DC | PRN

## 2014-09-19 NOTE — MAU Provider Note (Signed)
  History     CSN: [id number]  Arrival date and time: 09/19/14 1339   First Provider Initiated Contact with Patient 09/19/14 1436      Chief Complaint  Patient presents with  . Possible Pregnancy  . Abdominal Pain  . Nausea   HPI Tanya Perez is [age] y.o. G3P1011 6631w4d weeks presenting for confirmation of pregnancy.  She has not taken a pregnancy test.  LMP 08/11/14.  She is having Nausea, vomiting and dizziness.  Denies abdominal pain or vaginal bleeding.  She does not plan to continue pregnancy and wants to know how far along she is to plan procedure.  She would like Rx for nausea.      History reviewed. No pertinent past medical history.  History reviewed. No pertinent past surgical history.  History reviewed. No pertinent family history.  History  Substance Use Topics  . Smoking status: Current Every Day Smoker -- 0.10 packs/day    Types: Cigarettes  . Smokeless tobacco: Never Used  . Alcohol Use: No    Allergies: No Known Allergies  Prescriptions prior to admission  Medication Sig Dispense Refill Last Dose  . sulfamethoxazole-trimethoprim (BACTRIM DS) 800-160 MG per tablet Take 1 tablet by mouth 2 (two) times daily. (Patient not taking: Reported on 09/19/2014) 14 tablet 0     Review of Systems  Constitutional: Negative for fever and chills.  Gastrointestinal: Positive for nausea and vomiting. Negative for abdominal pain.  Genitourinary:       Negative for vaginal bleeding  Neurological: Negative for headaches.   Physical Exam   Blood pressure 118/55, pulse 87, temperature 98.3 F (36.8 C), temperature source Oral, resp. rate 16, height 5' 0.5" (1.537 m), weight 120 lb (54.432 kg), last menstrual period 08/11/2014, unknown if currently breastfeeding.  Physical Exam  Constitutional: She is oriented to person, place, and time. She appears well-developed and well-nourished. No distress.  HENT:  Head: Normocephalic.  GI: Soft. There is no tenderness. There is no  rebound and no guarding.  Neurological: She is alert and oriented to person, place, and time.  Skin: Skin is warm and dry.  Psychiatric: She has a normal mood and affect. Her behavior is normal.   Results for orders placed or performed during the hospital encounter of 09/19/14 (from the past 24 hour(s))  Pregnancy, urine POC     Status: Abnormal   Collection Time: 09/19/14  2:11 PM  Result Value Ref Range   Preg Test, Ur POSITIVE (A) NEGATIVE   MAU Course  Procedures  MDM   Assessment and Plan  A:  + pregnancy test      5w 4d gestation by LMP      Nausea and vomiting associated with pregnancy  P  Rx for Phenergan for nausea      Patient plans termination       Cicely Ortner,EVE M 09/19/2014, 2:40 PM

## 2014-09-19 NOTE — MAU Note (Signed)
Also complaining of nausea and dizziness

## 2014-09-19 NOTE — Discharge Instructions (Signed)
Morning Sickness °Morning sickness is when you feel sick to your stomach (nauseous) during pregnancy. You may feel sick to your stomach and throw up (vomit). You may feel sick in the morning, but you can feel this way any time of day. Some women feel very sick to their stomach and cannot stop throwing up (hyperemesis gravidarum). °HOME CARE °· Only take medicines as told by your doctor. °· Take multivitamins as told by your doctor. Taking multivitamins before getting pregnant can stop or lessen the harshness of morning sickness. °· Eat dry toast or unsalted crackers before getting out of bed. °· Eat 5 to 6 small meals a day. °· Eat dry and bland foods like rice and baked potatoes. °· Do not drink liquids with meals. Drink between meals. °· Do not eat greasy, fatty, or spicy foods. °· Have someone cook for you if the smell of food causes you to feel sick or throw up. °· If you feel sick to your stomach after taking prenatal vitamins, take them at night or with a snack. °· Eat protein when you need a snack (nuts, yogurt, cheese). °· Eat unsweetened gelatins for dessert. °· Wear a bracelet used for sea sickness (acupressure wristband). °· Go to a doctor that puts thin needles into certain body points (acupuncture) to improve how you feel. °· Do not smoke. °· Use a humidifier to keep the air in your house free of odors. °· Get lots of fresh air. °GET HELP IF: °· You need medicine to feel better. °· You feel dizzy or lightheaded. °· You are losing weight. °GET HELP RIGHT AWAY IF:  °· You feel very sick to your stomach and cannot stop throwing up. °· You pass out (faint). °MAKE SURE YOU: °· Understand these instructions. °· Will watch your condition. °· Will get help right away if you are not doing well or get worse. °Document Released: 11/10/2004 Document Revised: 10/08/2013 Document Reviewed: 03/20/2013 °ExitCare® Patient Information ©2015 [employer]. This information is not intended to replace advice given to you by  your health care provider. Make sure you discuss any questions you have with your health care provider. ° °

## 2015-07-25 ENCOUNTER — Encounter (HOSPITAL_COMMUNITY): Payer: Self-pay | Admitting: *Deleted

## 2017-07-04 ENCOUNTER — Ambulatory Visit (HOSPITAL_COMMUNITY)
Admission: EM | Admit: 2017-07-04 | Discharge: 2017-07-04 | Disposition: A | Discharge status: Home / Self Care | Payer: Medicaid Other | Attending: Family Medicine | Admitting: Family Medicine

## 2017-07-04 ENCOUNTER — Encounter (HOSPITAL_COMMUNITY): Payer: Self-pay | Admitting: Emergency Medicine

## 2017-07-04 DIAGNOSIS — R350 Frequency of micturition: Secondary | ICD-10-CM | POA: Diagnosis not present

## 2017-07-04 DIAGNOSIS — Z3202 Encounter for pregnancy test, result negative: Secondary | ICD-10-CM

## 2017-07-04 DIAGNOSIS — N898 Other specified noninflammatory disorders of vagina: Secondary | ICD-10-CM | POA: Diagnosis not present

## 2017-07-04 DIAGNOSIS — F17210 Nicotine dependence, cigarettes, uncomplicated: Secondary | ICD-10-CM | POA: Insufficient documentation

## 2017-07-04 LAB — POCT URINALYSIS DIP (DEVICE)
Bilirubin Urine: NEGATIVE
GLUCOSE, UA: NEGATIVE mg/dL
Hgb urine dipstick: NEGATIVE
KETONES UR: NEGATIVE mg/dL
LEUKOCYTES UA: NEGATIVE
Nitrite: NEGATIVE
Protein, ur: NEGATIVE mg/dL
Specific Gravity, Urine: 1.025 (ref 1.005–1.030)
UROBILINOGEN UA: 1 mg/dL (ref 0.0–1.0)
pH: 7 (ref 5.0–8.0)

## 2017-07-04 LAB — POCT PREGNANCY, URINE: PREG TEST UR: NEGATIVE

## 2017-07-04 MED ORDER — CEFTRIAXONE SODIUM 250 MG IJ SOLR
INTRAMUSCULAR | Status: AC
  Filled 2017-07-04: qty 250

## 2017-07-04 MED ORDER — AZITHROMYCIN 250 MG PO TABS
1000.0000 mg | ORAL_TABLET | Freq: Once | ORAL | Status: AC
  Administered 2017-07-04: 1000 mg via ORAL

## 2017-07-04 MED ORDER — CEFTRIAXONE SODIUM 250 MG IJ SOLR
250.0000 mg | Freq: Once | INTRAMUSCULAR | Status: AC
  Administered 2017-07-04: 250 mg via INTRAMUSCULAR

## 2017-07-04 MED ORDER — AZITHROMYCIN 250 MG PO TABS
ORAL_TABLET | ORAL | Status: AC
  Filled 2017-07-04: qty 4

## 2017-07-04 MED ORDER — STERILE WATER FOR INJECTION IJ SOLN
INTRAMUSCULAR | Status: AC
  Filled 2017-07-04: qty 10

## 2017-07-04 MED ORDER — AZITHROMYCIN 250 MG PO TABS
ORAL_TABLET | ORAL | Status: AC
  Filled 2017-07-04: qty 1

## 2017-07-04 NOTE — ED Triage Notes (Signed)
PT reports urgency and burning with urination since yesterday.

## 2017-07-04 NOTE — Discharge Instructions (Signed)
You have been given the following medications today for treatment of suspected gonorrhea and/or chlamydia:  cefTRIAXone (ROCEPHIN) injection 250 mg azithromycin (ZITHROMAX) tablet 1,000 mg  Even though we have treated you today, we have sent testing for sexually transmitted infections. We will notify you of the results once they are received. If needed we will prescribe any medications needed. We have also sent a urine culture.

## 2017-07-05 NOTE — ED Provider Notes (Signed)
  [facility]   [id number] 07/04/17 Arrival Time: 1656  ASSESSMENT & PLAN:  Today you were diagnosed with the following: 1. Vaginal discharge    Meds ordered this encounter  Medications  . cefTRIAXone (ROCEPHIN) injection 250 mg  . azithromycin (ZITHROMAX) tablet 1,000 mg   Urine cytology sent. Will notify when results are available. Also sent urine culture. Refrain from sexual activity for at least 7 days. May f/u as needed. Reviewed expectations re: course of current medical issues. Questions answered. Outlined signs and symptoms indicating need for more acute intervention. Patient verbalized understanding. After Visit Summary given.   SUBJECTIVE:  Tanya Perez is a 31 y.o. female who presents with complaint of vaginal discharge for a few days. Describes discharge as thin. Onset gradual. Urinary symptoms: urinary frequency. Afebrile. No abdominal or pelvic pain. No n/v. No rashes or lesions. Sexually active with single female partner(s). Occasional condom use. OTC treatment: None.Marland Kitchen History of similar symptoms: No.  Patient's last menstrual period was 06/20/2017.  ROS: As per HPI.  OBJECTIVE:  Vitals:   07/04/17 1724 07/04/17 1726  BP:  111/72  Pulse:  70  Resp:  16  Temp:  98.4 F (36.9 C)  TempSrc:  Oral  SpO2:  100%  Weight: 110 lb (49.9 kg)   Height: 5' (1.524 m)      General appearance: alert, cooperative, appears stated age and no distress Throat: lips, mucosa, and tongue normal; teeth and gums normal Back: no CVA tenderness Abdomen: soft, non-tender; bowel sounds normal; no masses or organomegaly; no guarding or rebound tenderness GU: declines Skin: warm and dry Psychological:  Alert and cooperative. Normal mood and affect.  Results for orders placed or performed during the hospital encounter of 07/04/17  POCT urinalysis dip (device)  Result Value Ref Range   Glucose, UA NEGATIVE NEGATIVE mg/dL   Bilirubin Urine NEGATIVE NEGATIVE   Ketones,  ur NEGATIVE NEGATIVE mg/dL   Specific Gravity, Urine 1.025 1.005 - 1.030   Hgb urine dipstick NEGATIVE NEGATIVE   pH 7.0 5.0 - 8.0   Protein, ur NEGATIVE NEGATIVE mg/dL   Urobilinogen, UA 1.0 0.0 - 1.0 mg/dL   Nitrite NEGATIVE NEGATIVE   Leukocytes, UA NEGATIVE NEGATIVE  Pregnancy, urine POC  Result Value Ref Range   Preg Test, Ur NEGATIVE NEGATIVE    Labs Reviewed  URINE CULTURE  POCT URINALYSIS DIP (DEVICE)  POCT PREGNANCY, URINE  URINE CYTOLOGY ANCILLARY ONLY    No Known Allergies   Social History   Social History  . Marital status: Single    Spouse name: N/A  . Number of children: N/A  . Years of education: N/A   Occupational History  . Not on file.   Social History Main Topics  . Smoking status: Current Some Day Smoker    Packs/day: 0.10    Types: Cigarettes  . Smokeless tobacco: Never Used  . Alcohol use Yes  . Drug use: No     Comment: about a month ago  . Sexual activity: Yes    Birth control/ protection: Pill   Other Topics Concern  . Not on file   Social History Narrative  . No narrative on file          Mardella Layman, MD 07/05/17 1001

## 2017-07-06 LAB — URINE CYTOLOGY ANCILLARY ONLY
Chlamydia: NEGATIVE
Neisseria Gonorrhea: NEGATIVE
Trichomonas: NEGATIVE

## 2017-07-07 ENCOUNTER — Telehealth (HOSPITAL_COMMUNITY): Payer: Self-pay | Admitting: Internal Medicine

## 2017-07-07 LAB — URINE CULTURE

## 2017-07-07 LAB — URINE CYTOLOGY ANCILLARY ONLY: Candida vaginitis: NEGATIVE

## 2017-07-07 MED ORDER — METRONIDAZOLE 500 MG PO TABS: 500.0000 mg | ORAL_TABLET | Freq: Two times a day (BID) | ORAL | 0 refills | Status: DC

## 2017-07-07 MED ORDER — NITROFURANTOIN MONOHYD MACRO 100 MG PO CAPS: 100.0000 mg | ORAL_CAPSULE | Freq: Two times a day (BID) | ORAL | 0 refills | Status: DC

## 2017-07-07 NOTE — Telephone Encounter (Signed)
Clinical staff, please let patient know that urine culture was positive for Enterococcus germ.  Clarification:  This was not treated at the urgent care visit 9/18 (see previous result note of 9/20).  Rx nitrofurantoin sent to the pharmacy of record, [facility] on [address].   Test for gardnerella (bacterial vaginosis) was also positive.  Will send rx for metronidazole to pharmacy as well. Recheck or followup with PCP for further evaluation if symptoms are not improving.  LM

## 2018-02-19 ENCOUNTER — Encounter: Payer: Medicaid Other | Admitting: Obstetrics and Gynecology

## 2019-07-10 ENCOUNTER — Other Ambulatory Visit: Payer: Self-pay

## 2019-07-10 ENCOUNTER — Encounter (HOSPITAL_COMMUNITY): Payer: Self-pay | Admitting: Emergency Medicine

## 2019-07-10 ENCOUNTER — Ambulatory Visit (HOSPITAL_COMMUNITY)
Admission: EM | Admit: 2019-07-10 | Discharge: 2019-07-10 | Disposition: A | Discharge status: Home / Self Care | Payer: Medicaid Other | Attending: Family Medicine | Admitting: Family Medicine

## 2019-07-10 DIAGNOSIS — N611 Abscess of the breast and nipple: Secondary | ICD-10-CM | POA: Diagnosis not present

## 2019-07-10 MED ORDER — CEPHALEXIN 500 MG PO CAPS: 500.0000 mg | ORAL_CAPSULE | Freq: Four times a day (QID) | ORAL | 0 refills | Status: DC

## 2019-07-10 NOTE — ED Notes (Signed)
Patient able to ambulate independently  

## 2019-07-10 NOTE — ED Provider Notes (Signed)
[facility]    CSN: [id number] Arrival date & time: 07/10/19  1338      History   Chief Complaint Chief Complaint  Patient presents with  . Breast Pain    HPI Tanya Perez is a 33 y.o. female.   Patient is a 33 year old female who presents today with left nipple pain and swelling.  Reports this has been constant and worsening over the past couple of  days.  History of nipple ring that she removed over a year ago.  Has not had any issues since.  Denies any discharge from the nipple currently.  She did warm compress this morning.  Denies any surrounding breast pain.  No fevers, chills. The right breast is normal.   ROS per HPI      History reviewed. No pertinent past medical history.  There are no active problems to display for this patient.   History reviewed. No pertinent surgical history.  OB History    Gravida  3   Para  1   Term  1   Preterm      AB  1   Living  1     SAB  1   TAB      Ectopic      Multiple      Live Births  1            Home Medications    Prior to Admission medications   Medication Sig Start Date End Date Taking? Authorizing Provider  cephALEXin (KEFLEX) 500 MG capsule Take 1 capsule (500 mg total) by mouth 4 (four) times daily. 07/10/19   Dahlia Byes A, NP  promethazine (PHENERGAN) 12.5 MG tablet Take 1 tablet (12.5 mg total) by mouth every 6 (six) hours as needed for nausea or vomiting. 09/19/14 07/10/19  KeyVerita Schneiders, NP    Family History History reviewed. No pertinent family history.  Social History Social History   Tobacco Use  . Smoking status: Current Some Day Smoker    Packs/day: 0.10    Types: Cigarettes  . Smokeless tobacco: Never Used  Substance Use Topics  . Alcohol use: Yes  . Drug use: No    Comment: about a month ago     Allergies   Patient has no known allergies.   Review of Systems Review of Systems   Physical Exam Triage Vital Signs ED Triage Vitals  Enc Vitals Group      BP 07/10/19 1401 110/76     Pulse Rate 07/10/19 1401 82     Resp 07/10/19 1401 16     Temp 07/10/19 1401 98.4 F (36.9 C)     Temp Source 07/10/19 1401 Temporal     SpO2 07/10/19 1401 100 %     Weight --      Height --      Head Circumference --      Peak Flow --      Pain Score 07/10/19 1407 8     Pain Loc --      Pain Edu? --      Excl. in [place]? --    No data found.  Updated Vital Signs BP 110/76 (BP Location: Left Arm)   Pulse 82   Temp 98.4 F (36.9 C) (Temporal)   Resp 16   SpO2 100%   Visual Acuity Right Eye Distance:   Left Eye Distance:   Bilateral Distance:    Right Eye Near:   Left Eye Near:    Bilateral  Near:     Physical Exam Vitals signs and nursing note reviewed.  Constitutional:      General: She is not in acute distress.    Appearance: Normal appearance. She is not ill-appearing, toxic-appearing or diaphoretic.  HENT:     Head: Normocephalic.     Nose: Nose normal.     Mouth/Throat:     Pharynx: Oropharynx is clear.  Eyes:     Conjunctiva/sclera: Conjunctivae normal.  Neck:     Musculoskeletal: Normal range of motion.  Pulmonary:     Effort: Pulmonary effort is normal.  Chest:     Breasts:        Left: Swelling and tenderness present.       Comments: Swelling and tenderness to the left nipple.  Breast non tender.  Musculoskeletal: Normal range of motion.  Skin:    General: Skin is warm and dry.     Findings: No rash.  Neurological:     Mental Status: She is alert.  Psychiatric:        Mood and Affect: Mood normal.      UC Treatments / Results  Labs (all labs ordered are listed, but only abnormal results are displayed) Labs Reviewed - No data to display  EKG   Radiology No results found.  Procedures Procedures (including critical care time)  Medications Ordered in UC Medications - No data to display  Initial Impression / Assessment and Plan / UC Course  I have reviewed the triage vital signs and the nursing  notes.  Pertinent labs & imaging results that were available during my care of the patient were reviewed by me and considered in my medical decision making (see chart for details).     Nipple abscess- treating with keflex.  Recommended warm compresses multiple times a day and massage to promote drainage. Ibuprofen for pain Follow up as needed for continued or worsening symptoms   Final Clinical Impressions(s) / UC Diagnoses   Final diagnoses:  Abscess of left nipple     Discharge Instructions     Keflex for infection  Warm compresses and few times a day.  Massage the area.  Ibuprofen for pain     ED Prescriptions    Medication Sig Dispense Auth. Provider   cephALEXin (KEFLEX) 500 MG capsule Take 1 capsule (500 mg total) by mouth 4 (four) times daily. 28 capsule Elexia Friedt A, NP     PDMP not reviewed this encounter.   Loura Halt A, NP 07/11/19 [phone]

## 2019-07-10 NOTE — Discharge Instructions (Addendum)
Keflex for infection  Warm compresses and few times a day.  Massage the area.  Ibuprofen for pain

## 2019-07-10 NOTE — ED Triage Notes (Signed)
Pt presents to [facility] for assessment of lump and pain to left breast.  States lump is around nipple area.  C/o some discharge./

## 2019-11-22 ENCOUNTER — Encounter (HOSPITAL_COMMUNITY): Payer: Self-pay | Admitting: Emergency Medicine

## 2019-11-22 ENCOUNTER — Other Ambulatory Visit: Payer: Self-pay

## 2019-11-22 ENCOUNTER — Ambulatory Visit (HOSPITAL_COMMUNITY)
Admission: EM | Admit: 2019-11-22 | Discharge: 2019-11-22 | Disposition: A | Discharge status: Home / Self Care | Payer: Medicaid Other | Attending: Family Medicine | Admitting: Family Medicine

## 2019-11-22 DIAGNOSIS — R0602 Shortness of breath: Secondary | ICD-10-CM | POA: Diagnosis not present

## 2019-11-22 DIAGNOSIS — M791 Myalgia, unspecified site: Secondary | ICD-10-CM

## 2019-11-22 DIAGNOSIS — U071 COVID-19: Secondary | ICD-10-CM | POA: Diagnosis not present

## 2019-11-22 NOTE — ED Triage Notes (Signed)
Pt states she tested positive for covid on jan 26th, and she was supposed to return to work yesterday, works at [employer], went to work yesterday and had to leave halfway through her shift due to body aches. States she couldn't sleep last night.

## 2019-11-22 NOTE — ED Provider Notes (Signed)
[facility]    CSN: [id number] Arrival date & time: 11/22/19  1051      History   Chief Complaint Chief Complaint  Patient presents with  . Covid Positive  . Generalized Body Aches    HPI Tanya Perez is a 34 y.o. female.   HPI  Patient was diagnosed with coronavirus on 11/12/2019.  She states that her mother and family members had it as well.  She is 10 days after her illness.  She was released to work yesterday.  She went to work and had to leave early.  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  She states that she made it through about half of her shift before she became so exhausted, short of breath and achy that she did not feel that she could continue.  She is here for a medical evaluation medical note. No more fever or chills.  No coughing.  Appetite is still poor.  Very tired.  Achy today.  Short of breath only with exertion  History reviewed. No pertinent past medical history.  There are no problems to display for this patient.   History reviewed. No pertinent surgical history.  OB History    Gravida  3   Para  1   Term  1   Preterm      AB  1   Living  1     SAB  1   TAB      Ectopic      Multiple      Live Births  1            Home Medications    Prior to Admission medications   Medication Sig Start Date End Date Taking? Authorizing Provider  promethazine (PHENERGAN) 12.5 MG tablet Take 1 tablet (12.5 mg total) by mouth every 6 (six) hours as needed for nausea or vomiting. 09/19/14 07/10/19  KeyVerita Schneiders, NP    Family History No family history on file.  Social History Social History   Tobacco Use  . Smoking status: Current Some Day Smoker    Packs/day: 0.10    Types: Cigarettes  . Smokeless tobacco: Never Used  Substance Use Topics  . Alcohol use: Yes  . Drug use: No    Comment: about a month ago      Allergies   Patient has no known allergies.   Review of Systems Review of Systems  Constitutional: Positive for appetite change and fatigue. Negative for chills and fever.  HENT: Negative for congestion and sore throat.   Respiratory: Positive for shortness of breath. Negative for cough.   Cardiovascular: Negative for chest pain.  Gastrointestinal: Negative for nausea and vomiting.  Musculoskeletal: Positive for myalgias.  Neurological: Negative for dizziness and headaches.     Physical Exam Triage Vital Signs ED Triage Vitals  Enc Vitals Group     BP 11/22/19 1144 120/80     Pulse Rate 11/22/19 1144 89     Resp 11/22/19 1144 18     Temp 11/22/19 1144 98.9 F (37.2 C)     Temp src --      SpO2 11/22/19 1144 97 %     Weight --      Height --      Head Circumference --      Peak Flow --      Pain  Score 11/22/19 1146 8     Pain Loc --      Pain Edu? --      Excl. in [place]? --    No data found.  Updated Vital Signs BP 120/80   Pulse 89   Temp 98.9 F (37.2 C)   Resp 18   LMP 11/06/2019   SpO2 97%   Visual Acuity Right Eye Distance:   Left Eye Distance:   Bilateral Distance:    Right Eye Near:   Left Eye Near:    Bilateral Near:     Physical Exam Constitutional:      General: She is not in acute distress.    Appearance: She is well-developed and normal weight. She is ill-appearing.     Comments: Appears ill, very tired  HENT:     Head: Normocephalic and atraumatic.     Mouth/Throat:     Comments: Mask in place Eyes:     Conjunctiva/sclera: Conjunctivae normal.     Pupils: Pupils are equal, round, and reactive to light.  Cardiovascular:     Rate and Rhythm: Normal rate.  Pulmonary:     Effort: Pulmonary effort is normal. No respiratory distress.     Breath sounds: Normal breath sounds. No wheezing or rales.     Comments: Lungs are clear Musculoskeletal:        General: Normal range of motion.     Cervical back: Normal range of motion and neck  supple.  Lymphadenopathy:     Cervical: No cervical adenopathy.  Skin:    General: Skin is warm and dry.  Neurological:     General: No focal deficit present.     Mental Status: She is alert.  Psychiatric:        Mood and Affect: Mood normal.        Behavior: Behavior normal.      UC Treatments / Results  Labs (all labs ordered are listed, but only abnormal results are displayed) Labs Reviewed - No data to display  EKG   Radiology No results found.  Procedures Procedures (including critical care time)  Medications Ordered in UC Medications - No data to display  Initial Impression / Assessment and Plan / UC Course  I have reviewed the triage vital signs and the nursing notes.  Pertinent labs & imaging results that were available during my care of the patient were reviewed by me and considered in my medical decision making (see chart for details).     Viewed that she is not fully recovered yet from her COVID-19 infection.  She needs more time to rest.  Lungs are clear no evidence of pneumonia.  Expect improvement over time Final Clinical Impressions(s) / UC Diagnoses   Final diagnoses:  COVID-19  Myalgia     Discharge Instructions     Continue to rest and push fluids Increase your activity a little bit at a time Return to work on Monday.   ED Prescriptions    None     PDMP not reviewed this encounter.   Raylene Everts, MD 11/22/19 [phone]

## 2019-11-22 NOTE — Discharge Instructions (Addendum)
Continue to rest and push fluids Increase your activity a little bit at a time Return to work on Monday.

## 2020-02-19 ENCOUNTER — Encounter (HOSPITAL_COMMUNITY): Payer: Self-pay | Admitting: Emergency Medicine

## 2020-02-19 ENCOUNTER — Other Ambulatory Visit: Payer: Self-pay

## 2020-02-19 ENCOUNTER — Ambulatory Visit (HOSPITAL_COMMUNITY)
Admission: EM | Admit: 2020-02-19 | Discharge: 2020-02-19 | Disposition: A | Discharge status: Home / Self Care | Payer: Medicaid Other | Attending: Emergency Medicine | Admitting: Emergency Medicine

## 2020-02-19 DIAGNOSIS — Z3202 Encounter for pregnancy test, result negative: Secondary | ICD-10-CM | POA: Diagnosis not present

## 2020-02-19 DIAGNOSIS — N3000 Acute cystitis without hematuria: Secondary | ICD-10-CM

## 2020-02-19 DIAGNOSIS — G43809 Other migraine, not intractable, without status migrainosus: Secondary | ICD-10-CM

## 2020-02-19 DIAGNOSIS — R100 Acute abdomen: Secondary | ICD-10-CM | POA: Diagnosis not present

## 2020-02-19 DIAGNOSIS — R42 Dizziness and giddiness: Secondary | ICD-10-CM | POA: Diagnosis not present

## 2020-02-19 DIAGNOSIS — R110 Nausea: Secondary | ICD-10-CM

## 2020-02-19 LAB — POCT URINALYSIS DIP (DEVICE)
Bilirubin Urine: NEGATIVE
Glucose, UA: NEGATIVE mg/dL
Hgb urine dipstick: NEGATIVE
Ketones, ur: NEGATIVE mg/dL
Leukocytes,Ua: NEGATIVE
Nitrite: POSITIVE — AB
Protein, ur: NEGATIVE mg/dL
Specific Gravity, Urine: 1.025 (ref 1.005–1.030)
Urobilinogen, UA: 0.2 mg/dL (ref 0.0–1.0)
pH: 7 (ref 5.0–8.0)

## 2020-02-19 LAB — POC URINE PREG, ED: Preg Test, Ur: NEGATIVE

## 2020-02-19 MED ORDER — ONDANSETRON 4 MG PO TBDP
4.0000 mg | ORAL_TABLET | Freq: Once | ORAL | Status: AC
  Administered 2020-02-19: 13:00:00 4 mg via ORAL

## 2020-02-19 MED ORDER — NITROFURANTOIN MONOHYD MACRO 100 MG PO CAPS: 100.0000 mg | ORAL_CAPSULE | Freq: Two times a day (BID) | ORAL | 0 refills | Status: DC

## 2020-02-19 MED ORDER — ACETAMINOPHEN 325 MG PO TABS: 650.0000 mg | ORAL_TABLET | Freq: Once | ORAL | Status: DC

## 2020-02-19 MED ORDER — ACETAMINOPHEN 325 MG PO TABS
ORAL_TABLET | ORAL | Status: AC
  Filled 2020-02-19: qty 2

## 2020-02-19 MED ORDER — ONDANSETRON 4 MG PO TBDP
ORAL_TABLET | ORAL | Status: AC
  Filled 2020-02-19: qty 1

## 2020-02-19 MED ORDER — ACETAMINOPHEN 325 MG PO TABS
650.0000 mg | ORAL_TABLET | Freq: Once | ORAL | Status: AC
  Administered 2020-02-19: 650 mg via ORAL

## 2020-02-19 MED ORDER — ONDANSETRON 8 MG PO TBDP: 8.0000 mg | ORAL_TABLET | Freq: Three times a day (TID) | ORAL | 0 refills | Status: DC | PRN

## 2020-02-19 NOTE — ED Provider Notes (Signed)
[place]   MRN: [id number] DOB: 05/07/1986  Subjective:   Tanya Perez is a 34 y.o. female presenting for 5-day history of intermittent dizziness and nausea.  She had one episode of vomiting today.  Her mother is a diabetic and therefore checked her blood sugar and it was 93.  Felt like she had a headache today, photophobia.  Denies history of migraines.  No current facility-administered medications for this encounter. No current outpatient medications on file.   No Known Allergies  History reviewed. No pertinent past medical history.   History reviewed. No pertinent surgical history.  No family history on file.  Social History   Tobacco Use  . Smoking status: Current Some Day Smoker    Packs/day: 0.10    Types: Cigarettes  . Smokeless tobacco: Never Used  Substance Use Topics  . Alcohol use: Yes  . Drug use: No    Comment: about a month ago    Review of Systems  Constitutional: Negative for fever and malaise/fatigue.  HENT: Negative for congestion, ear pain, sinus pain and sore throat.   Eyes: Positive for photophobia. Negative for blurred vision, double vision, discharge and redness.  Respiratory: Negative for cough, hemoptysis, shortness of breath and wheezing.   Cardiovascular: Negative for chest pain.  Gastrointestinal: Negative for abdominal pain, diarrhea, nausea and vomiting.  Genitourinary: Negative for dysuria, flank pain and hematuria.  Musculoskeletal: Negative for myalgias.  Skin: Negative for rash.  Neurological: Positive for headaches. Negative for dizziness and weakness.  Psychiatric/Behavioral: Negative for depression and substance abuse.     Objective:   Vitals: BP 119/82   Pulse 62   Temp 98.3 F (36.8 C) (Oral)   Resp 18   LMP 02/09/2020   SpO2 100%   Physical Exam Constitutional:      General: She is not in acute distress.    Appearance: Normal appearance. She is well-developed and normal weight. She is not  ill-appearing, toxic-appearing or diaphoretic.  HENT:     Head: Normocephalic and atraumatic.     Right Ear: External ear normal.     Left Ear: External ear normal.     Nose: Nose normal.     Mouth/Throat:     Mouth: Mucous membranes are moist.     Pharynx: Oropharynx is clear.  Eyes:     General: No scleral icterus.    Extraocular Movements: Extraocular movements intact.     Pupils: Pupils are equal, round, and reactive to light.  Cardiovascular:     Rate and Rhythm: Normal rate and regular rhythm.     Pulses: Normal pulses.     Heart sounds: Normal heart sounds. No murmur. No friction rub. No gallop.   Pulmonary:     Effort: Pulmonary effort is normal. No respiratory distress.     Breath sounds: Normal breath sounds. No stridor. No wheezing, rhonchi or rales.  Abdominal:     General: Bowel sounds are normal. There is no distension.     Palpations: Abdomen is soft. There is no mass.     Tenderness: There is no abdominal tenderness. There is no right CVA tenderness, left CVA tenderness, guarding or rebound.  Skin:    General: Skin is warm and dry.     Coloration: Skin is not pale.     Findings: No rash.  Neurological:     General: No focal deficit present.     Mental Status: She is alert and oriented to person, place, and time.  Cranial Nerves: No cranial nerve deficit.     Motor: No weakness.     Coordination: Coordination normal.     Gait: Gait normal.     Deep Tendon Reflexes: Reflexes normal.  Psychiatric:        Mood and Affect: Mood normal.        Behavior: Behavior normal.        Thought Content: Thought content normal.        Judgment: Judgment normal.     Results for orders placed or performed during the hospital encounter of 02/19/20 (from the past 24 hour(s))  POCT urinalysis dip (device)     Status: Abnormal   Collection Time: 02/19/20  1:55 PM  Result Value Ref Range   Glucose, UA NEGATIVE NEGATIVE mg/dL   Bilirubin Urine NEGATIVE NEGATIVE   Ketones,  ur NEGATIVE NEGATIVE mg/dL   Specific Gravity, Urine 1.025 1.005 - 1.030   Hgb urine dipstick NEGATIVE NEGATIVE   pH 7.0 5.0 - 8.0   Protein, ur NEGATIVE NEGATIVE mg/dL   Urobilinogen, UA 0.2 0.0 - 1.0 mg/dL   Nitrite POSITIVE (A) NEGATIVE   Leukocytes,Ua NEGATIVE NEGATIVE  POC urine pregnancy     Status: None   Collection Time: 02/19/20  2:04 PM  Result Value Ref Range   Preg Test, Ur NEGATIVE NEGATIVE    Assessment and Plan :   PDMP not reviewed this encounter.  1. Acute cystitis without hematuria   2. Nausea without vomiting   3. Dizziness   4. Other migraine without status migrainosus, not intractable     Patient given Tylenol and Zofran in clinic for her headache and nausea. Continue this regimen as an outpatient, start Macrobid, urine culture pending. Counseled patient on potential for adverse effects with medications prescribed/recommended today, ER and return-to-clinic precautions discussed, patient verbalized understanding.    Wallis Bamberg, [place] 02/21/20 1151

## 2020-02-19 NOTE — ED Triage Notes (Signed)
PT reports nausea and dizziness. She vomited today. She checked her glucose and it was 93 today. She has a bad headache today with photophobia.   Symptoms started on Friday. No history of migraines.

## 2020-02-21 LAB — URINE CULTURE: Culture: >=100000 — AB

## 2021-05-22 ENCOUNTER — Emergency Department (HOSPITAL_BASED_OUTPATIENT_CLINIC_OR_DEPARTMENT_OTHER): Payer: Medicaid Other

## 2021-05-22 ENCOUNTER — Emergency Department (HOSPITAL_BASED_OUTPATIENT_CLINIC_OR_DEPARTMENT_OTHER)
Admission: EM | Admit: 2021-05-22 | Discharge: 2021-05-22 | Disposition: A | Discharge status: Home / Self Care | Payer: Medicaid Other | Attending: Emergency Medicine | Admitting: Emergency Medicine

## 2021-05-22 ENCOUNTER — Encounter (HOSPITAL_BASED_OUTPATIENT_CLINIC_OR_DEPARTMENT_OTHER): Payer: Self-pay

## 2021-05-22 ENCOUNTER — Other Ambulatory Visit: Payer: Self-pay

## 2021-05-22 DIAGNOSIS — O26892 Other specified pregnancy related conditions, second trimester: Secondary | ICD-10-CM | POA: Insufficient documentation

## 2021-05-22 DIAGNOSIS — F17210 Nicotine dependence, cigarettes, uncomplicated: Secondary | ICD-10-CM | POA: Diagnosis not present

## 2021-05-22 DIAGNOSIS — Z3A14 14 weeks gestation of pregnancy: Secondary | ICD-10-CM | POA: Insufficient documentation

## 2021-05-22 DIAGNOSIS — R102 Pelvic and perineal pain: Secondary | ICD-10-CM | POA: Diagnosis not present

## 2021-05-22 DIAGNOSIS — N939 Abnormal uterine and vaginal bleeding, unspecified: Secondary | ICD-10-CM | POA: Insufficient documentation

## 2021-05-22 DIAGNOSIS — N80129 Deep endometriosis of ovary, unspecified ovary: Secondary | ICD-10-CM

## 2021-05-22 DIAGNOSIS — N809 Endometriosis, unspecified: Secondary | ICD-10-CM

## 2021-05-22 DIAGNOSIS — R58 Hemorrhage, not elsewhere classified: Secondary | ICD-10-CM

## 2021-05-22 DIAGNOSIS — O4690 Antepartum hemorrhage, unspecified, unspecified trimester: Secondary | ICD-10-CM

## 2021-05-22 LAB — URINALYSIS, ROUTINE W REFLEX MICROSCOPIC
Bilirubin Urine: NEGATIVE
Glucose, UA: NEGATIVE mg/dL
Ketones, ur: NEGATIVE mg/dL
Leukocytes,Ua: NEGATIVE
Nitrite: NEGATIVE
Protein, ur: NEGATIVE mg/dL
Specific Gravity, Urine: 1.025 (ref 1.005–1.030)
pH: 6.5 (ref 5.0–8.0)

## 2021-05-22 LAB — CBC WITH DIFFERENTIAL/PLATELET
Abs Immature Granulocytes: 0.03 10*3/uL (ref 0.00–0.07)
Basophils Absolute: 0 10*3/uL (ref 0.0–0.1)
Basophils Relative: 0 %
Eosinophils Absolute: 0.1 10*3/uL (ref 0.0–0.5)
Eosinophils Relative: 2 %
HCT: 33.5 % — ABNORMAL LOW (ref 36.0–46.0)
Hemoglobin: 11.4 g/dL — ABNORMAL LOW (ref 12.0–15.0)
Immature Granulocytes: 1 %
Lymphocytes Relative: 21 %
Lymphs Abs: 1.3 10*3/uL (ref 0.7–4.0)
MCH: 31.7 pg (ref 26.0–34.0)
MCHC: 34 g/dL (ref 30.0–36.0)
MCV: 93.1 fL (ref 80.0–100.0)
Monocytes Absolute: 0.5 10*3/uL (ref 0.1–1.0)
Monocytes Relative: 9 %
Neutro Abs: 4.2 10*3/uL (ref 1.7–7.7)
Neutrophils Relative %: 67 %
Platelets: 193 10*3/uL (ref 150–400)
RBC: 3.6 MIL/uL — ABNORMAL LOW (ref 3.87–5.11)
RDW: 12.4 % (ref 11.5–15.5)
WBC: 6.2 10*3/uL (ref 4.0–10.5)
nRBC: 0 % (ref 0.0–0.2)

## 2021-05-22 LAB — BASIC METABOLIC PANEL
Anion gap: 5 (ref 5–15)
BUN: 8 mg/dL (ref 6–20)
CO2: 24 mmol/L (ref 22–32)
Calcium: 8.9 mg/dL (ref 8.9–10.3)
Chloride: 107 mmol/L (ref 98–111)
Creatinine, Ser: 0.63 mg/dL (ref 0.44–1.00)
GFR, Estimated: >60 mL/min (ref >60)
Glucose, Bld: 97 mg/dL (ref 70–99)
Potassium: 4 mmol/L (ref 3.5–5.1)
Sodium: 136 mmol/L (ref 135–145)

## 2021-05-22 LAB — URINALYSIS, MICROSCOPIC (REFLEX)

## 2021-05-22 LAB — HCG, QUANTITATIVE, PREGNANCY: hCG, Beta Chain, Quant, S: 634 m[IU]/mL — ABNORMAL HIGH (ref <5)

## 2021-05-22 MED ORDER — OXYCODONE-ACETAMINOPHEN 5-325 MG PO TABS: 1.0000 | ORAL_TABLET | Freq: Once | ORAL | Status: DC

## 2021-05-22 MED ORDER — ONDANSETRON 4 MG PO TBDP
8.0000 mg | ORAL_TABLET | Freq: Once | ORAL | Status: DC
Start: 2021-05-22 — End: 2021-05-22

## 2021-05-22 MED ORDER — ACETAMINOPHEN 325 MG PO TABS
650.0000 mg | ORAL_TABLET | Freq: Once | ORAL | Status: AC
  Administered 2021-05-22: 650 mg via ORAL
  Filled 2021-05-22: qty 2

## 2021-05-22 NOTE — ED Provider Notes (Signed)
[facility] EMERGENCY DEPARTMENT Provider Note   CSN: [id number] Arrival date & time: 05/22/21  0941     History Chief Complaint  Patient presents with  . Vaginal Bleeding    Pregnant    Tanya Perez is a 35 y.o. female.  HPI  Patient G2P1 presents with vaginal bleeding and pelvic cramping x2 days.  Cramping is bilateral in the pelvic area, constant throughout the day.  No alleviating factors, no aggravating factors.  She is not having any associated nausea, shortness of breath, syncope or vomiting.  The blood is bright red, has not passed any products of conception.  States she is going through 1 pad every 4 hours.  Last menstrual cycle was 04/08/2021.  She has not yet been seen by OBG, is not taking prenatal vitamins.  She does not have any other medical conditions which she takes medication, not on any blood thinners.  Patient is Rh+, had an uncomplicated vaginal delivery in 2015 to a healthy child.  History reviewed. No pertinent past medical history.  There are no problems to display for this patient.   History reviewed. No pertinent surgical history.   OB History     Gravida  4   Para  1   Term  1   Preterm      AB  1   Living  1      SAB  1   IAB      Ectopic      Multiple      Live Births  1           History reviewed. No pertinent family history.  Social History   Tobacco Use  . Smoking status: Some Days    Packs/day: 0.10    Types: Cigarettes  . Smokeless tobacco: Never  Substance Use Topics  . Alcohol use: Not Currently  . Drug use: Not Currently    Comment: about a month ago    Home Medications Prior to Admission medications   Medication Sig Start Date End Date Taking? Authorizing Provider  nitrofurantoin, macrocrystal-monohydrate, (MACROBID) 100 MG capsule Take 1 capsule (100 mg total) by mouth 2 (two) times daily. 02/19/20   Wallis Bamberg, PA-C  ondansetron (ZOFRAN-ODT) 8 MG disintegrating tablet Take 1 tablet (8 mg  total) by mouth every 8 (eight) hours as needed for nausea or vomiting. 02/19/20   Wallis Bamberg, PA-C  promethazine (PHENERGAN) 12.5 MG tablet Take 1 tablet (12.5 mg total) by mouth every 6 (six) hours as needed for nausea or vomiting. 09/19/14 07/10/19  KeyVerita Schneiders, NP    Allergies    Patient has no known allergies.  Review of Systems   Review of Systems  Constitutional:  Negative for chills and fever.  HENT:  Negative for ear pain and sore throat.   Eyes:  Negative for pain and visual disturbance.  Respiratory:  Negative for cough and shortness of breath.   Cardiovascular:  Negative for chest pain and palpitations.  Gastrointestinal:  Negative for abdominal pain, nausea and vomiting.  Genitourinary:  Positive for pelvic pain and vaginal bleeding. Negative for dysuria, flank pain, hematuria and vaginal discharge.  Musculoskeletal:  Negative for arthralgias and back pain.  Skin:  Negative for color change and rash.  Neurological:  Negative for seizures and syncope.  All other systems reviewed and are negative.  Physical Exam Updated Vital Signs BP 113/77 (BP Location: Right Arm)   Pulse 100   Temp 98.6 F (37 C) (Oral)  Resp 18   Ht 5' (1.524 m)   Wt 61.2 kg   LMP 04/08/2021 (Approximate)   SpO2 100%   BMI 26.37 kg/m   Physical Exam Vitals and nursing note reviewed. Exam conducted with a chaperone present.  Constitutional:      General: She is not in acute distress.    Appearance: Normal appearance.  HENT:     Head: Normocephalic and atraumatic.  Eyes:     General: No scleral icterus.    Extraocular Movements: Extraocular movements intact.     Pupils: Pupils are equal, round, and reactive to light.  Cardiovascular:     Rate and Rhythm: Normal rate and regular rhythm.  Pulmonary:     Effort: Pulmonary effort is normal.     Breath sounds: Normal breath sounds.  Abdominal:     General: Abdomen is flat.     Tenderness: There is abdominal tenderness.     Comments:  Patient has some right pelvic and left pelvic tenderness, although it is very mild.  There is no guarding or rigidity.  Abdomen is soft, no peritoneal signs  Skin:    Coloration: Skin is not jaundiced.  Neurological:     Mental Status: She is alert. Mental status is at baseline.     Coordination: Coordination normal.    ED Results / Procedures / Treatments   Labs (all labs ordered are listed, but only abnormal results are displayed) Labs Reviewed  CBC WITH DIFFERENTIAL/PLATELET  BASIC METABOLIC PANEL  PREGNANCY, URINE  URINALYSIS, ROUTINE W REFLEX MICROSCOPIC  HCG, QUANTITATIVE, PREGNANCY  TYPE AND SCREEN    EKG None  Radiology No results found.  Procedures Procedures   Medications Ordered in ED Medications - No data to display  ED Course  I have reviewed the triage vital signs and the nursing notes.  Pertinent labs & imaging results that were available during my care of the patient were reviewed by me and considered in my medical decision making (see chart for details).  Clinical Course as of 05/22/21 1447  Sat May 22, 2021  1136 Basic metabolic panel No electrolyte derangement, no anion gap [HS]  1137 CBC with Differential(!) Patiently is mildly anemic, but this is her baseline.  There is no leukocytosis.  Do not suspect significant amounts of acute blood loss. [HS]  1447 Urinalysis, Microscopic (reflex)(!) No UTI [HS]    Clinical Course User Index [HS] Theron Arista, PA-C   MDM Rules/Calculators/A&P                           Patient less than [redacted] weeks pregnant presents with vaginal bleeding and pelvic/lower abdominal cramping x2 days.  Differential includes threatened abortion, incomplete abortion, implantation, polyp, fibroids, diverticulitis, appendicitis, ovarian cyst.  Ectopic pregnancy is also a consideration.  Patient is Rh+ per chart review, will order labs and ultrasound to assess for possible ectopic.  Lab work-up is reassuring.  Patient is mildly  anemic, but this is her baseline.  No leukocytosis, beta-hCG appropriate for timeline.  Ultrasound did not show any evidence of ectopic, although did show signs of possible endometrioma.  Patient is resting comfortably, not in any acute distress.  Do not suspect she is experienced any significant blood loss.  Does not appear to be experiencing early pregnant loss.  Patient states she is seeing a women's care clinic about terminating the pregnancy on Tuesday.  I have advised her to follow-up with her OBG regarding the endometrioma for  MRI.  Return precautions were discussed and agreed upon.  She will follow-up on Tuesday of this week and will likely need repeat beta hCG depending on their process.  Return precautions were discussed and agreed upon.  At this time I do not suspect emergent pathology, her vitals are steady and she is appropriate for discharge.  Final Clinical Impression(s) / ED Diagnoses Final diagnoses:  None    Rx / DC Orders ED Discharge Orders     None        Theron Arista, PA-C 05/22/21 1440    Pollyann Savoy, MD 05/22/21 [phone]

## 2021-05-22 NOTE — Discharge Instructions (Addendum)
When you are seen on Tuesday by the women Center, please make sure they are aware of this visit unable to reevaluate you.  You do not need to be seen sooner than that unless you have different symptoms or you feel worse.  The ultrasound showed findings that could be consistent with an endometrioma.  I have included information about that which you can read about, please make sure you follow a schedule up appointment with your OBG he will likely need to order an MRI for further diagnostics. The exact wording is as follows:  Increased size of LEFT adnexal mass, 8.6 centimeters. The  appearance favors endometrioma. Given increased size, recommend MRI when appropriate.     If things change or worsen please come back to the ED.  In the meantime take Tylenol as needed for cramps, continue using menstrual pads as needed for vaginal bleeding.  We suspect the vaginal bleeding is due to recent implantation of the fetus, I do not suspect any emergent pathology at this time based on her work-up.

## 2021-05-22 NOTE — ED Triage Notes (Signed)
Pt states she is pregnant, LMP 6/23. States 2 days ago started having abdominal cramping with spotting. Now having larger amounts of vaginal bleeding.

## 2021-05-22 NOTE — ED Notes (Signed)
US at bedside

## 2021-05-24 ENCOUNTER — Encounter (HOSPITAL_COMMUNITY): Payer: Self-pay | Admitting: Obstetrics & Gynecology

## 2021-05-24 ENCOUNTER — Inpatient Hospital Stay (HOSPITAL_COMMUNITY)
Admission: AD | Admit: 2021-05-24 | Discharge: 2021-05-24 | Disposition: A | Discharge status: Home / Self Care | Payer: Medicaid Other | Attending: Obstetrics & Gynecology | Admitting: Obstetrics & Gynecology

## 2021-05-24 ENCOUNTER — Other Ambulatory Visit: Payer: Self-pay

## 2021-05-24 ENCOUNTER — Inpatient Hospital Stay (HOSPITAL_COMMUNITY): Payer: Medicaid Other

## 2021-05-24 DIAGNOSIS — O209 Hemorrhage in early pregnancy, unspecified: Secondary | ICD-10-CM

## 2021-05-24 DIAGNOSIS — O3680X0 Pregnancy with inconclusive fetal viability, not applicable or unspecified: Secondary | ICD-10-CM | POA: Diagnosis not present

## 2021-05-24 DIAGNOSIS — O200 Threatened abortion: Secondary | ICD-10-CM | POA: Diagnosis not present

## 2021-05-24 DIAGNOSIS — Z3A01 Less than 8 weeks gestation of pregnancy: Secondary | ICD-10-CM | POA: Diagnosis not present

## 2021-05-24 DIAGNOSIS — O4690 Antepartum hemorrhage, unspecified, unspecified trimester: Secondary | ICD-10-CM

## 2021-05-24 LAB — URINALYSIS, ROUTINE W REFLEX MICROSCOPIC
Bilirubin Urine: NEGATIVE
Glucose, UA: NEGATIVE mg/dL
Ketones, ur: NEGATIVE mg/dL
Leukocytes,Ua: NEGATIVE
Nitrite: NEGATIVE
Protein, ur: NEGATIVE mg/dL
Specific Gravity, Urine: 1.026 (ref 1.005–1.030)
pH: 6 (ref 5.0–8.0)

## 2021-05-24 LAB — HCG, QUANTITATIVE, PREGNANCY: hCG, Beta Chain, Quant, S: 534 m[IU]/mL — ABNORMAL HIGH (ref <5)

## 2021-05-24 MED ORDER — IBUPROFEN 800 MG PO TABS: 800.0000 mg | ORAL_TABLET | Freq: Three times a day (TID) | ORAL | 0 refills | Status: DC | PRN

## 2021-05-24 MED ORDER — KETOROLAC TROMETHAMINE 60 MG/2ML IM SOLN
60.0000 mg | Freq: Once | INTRAMUSCULAR | Status: AC
  Administered 2021-05-24: 60 mg via INTRAMUSCULAR
  Filled 2021-05-24: qty 2

## 2021-05-24 NOTE — MAU Provider Note (Signed)
History     CSN: [id number]  Arrival date and time: 05/24/21 1203   Event Date/Time   First Provider Initiated Contact with Patient 05/24/21 1551      Chief Complaint  Patient presents with   Vaginal Bleeding   HPI  Tanya Perez is a 35 y.o. female [id number] @ [redacted]w[redacted]d here in MAU with complaints of vaginal spotting. On Tuesday & Wednesday she was spotting. Thursday and Friday the bleeding was heavy enough to wear a pad. The Pain started Friday as well. [facility] and was told her pain and bleeding was normal because her quant was elevated. [facility] and had an US done that showed a gestational sac only. She has continued to have pain and bleeding. The bleeding is heavier than a menstrual cycle. Currently rates her lower back pain 8/10.   OB History     Gravida  3   Para  1   Term  1   Preterm      AB  1   Living  1      SAB  0   IAB  1   Ectopic      Multiple      Live Births  1           History reviewed. No pertinent past medical history.  History reviewed. No pertinent surgical history.  Family History  Problem Relation Age of Onset   Diabetes Mother    Healthy Father     Social History   Tobacco Use   Smoking status: Some Days    Packs/day: 0.10    Types: Cigarettes   Smokeless tobacco: Never  Vaping Use   Vaping Use: Never used  Substance Use Topics   Alcohol use: Not Currently   Drug use: Not Currently    Comment: about a month ago    Allergies: No Known Allergies  Medications Prior to Admission  Medication Sig Dispense Refill Last Dose   nitrofurantoin, macrocrystal-monohydrate, (MACROBID) 100 MG capsule Take 1 capsule (100 mg total) by mouth 2 (two) times daily. 14 capsule 0    ondansetron (ZOFRAN-ODT) 8 MG disintegrating tablet Take 1 tablet (8 mg total) by mouth every 8 (eight) hours as needed for nausea or vomiting. 15 tablet 0    Results for orders placed or performed during the  hospital encounter of 05/24/21 (from the past 72 hour(s))  Urinalysis, Routine w reflex microscopic Urine, Clean Catch     Status: Abnormal   Collection Time: 05/24/21  1:35 PM  Result Value Ref Range   Color, Urine YELLOW YELLOW   APPearance HAZY (A) CLEAR   Specific Gravity, Urine 1.026 1.005 - 1.030   pH 6.0 5.0 - 8.0   Glucose, UA NEGATIVE NEGATIVE mg/dL   Hgb urine dipstick LARGE (A) NEGATIVE   Bilirubin Urine NEGATIVE NEGATIVE   Ketones, ur NEGATIVE NEGATIVE mg/dL   Protein, ur NEGATIVE NEGATIVE mg/dL   Nitrite NEGATIVE NEGATIVE   Leukocytes,Ua NEGATIVE NEGATIVE   RBC / HPF 21-50 0 - 5 RBC/hpf   WBC, UA 0-5 0 - 5 WBC/hpf   Bacteria, UA RARE (A) NONE SEEN   Squamous Epithelial / LPF 6-10 0 - 5   Mucus PRESENT     Comment: Performed at [facility], [address], [place], [place] [address]  hCG, quantitative, pregnancy     Status: Abnormal   Collection Time: 05/24/21  2:59 PM  Result Value Ref Range   hCG, Beta Chain, Quant, S 534 (H) <5 mIU/mL    Comment:          GEST. AGE      CONC.  (mIU/mL)   <=1 WEEK        5 - 50     2 WEEKS       50 - 500     3 WEEKS       100 - 10,000     4 WEEKS     1,000 - 30,000     5 WEEKS     3,500 - 115,000   6-8 WEEKS     12,000 - 270,000    12 WEEKS     15,000 - 220,000        FEMALE AND NON-PREGNANT FEMALE:     LESS THAN 5 mIU/mL Performed at [facility], [address], [place], [place] [address]     Review of Systems  Constitutional:  Negative for fever.  Gastrointestinal:  Positive for abdominal pain.  Genitourinary:  Positive for vaginal bleeding.  Neurological:  Negative for headaches.  Physical Exam   Blood pressure 109/78, pulse 80, temperature 98.6 F (37 C), resp. rate 18, height 5' (1.524 m), weight 61.2 kg, last menstrual period 04/08/2021, unknown if currently breastfeeding.  Physical Exam Constitutional:      General: She is not in acute distress.    Appearance: Normal appearance. She is not  ill-appearing, toxic-appearing or diaphoretic.  HENT:     Head: Normocephalic.  Genitourinary:    Comments: Cervix: closed, middle.  Dark red blood noted on exam glove Exam by Venia Carbon, NP  Musculoskeletal:        General: Normal range of motion.  Neurological:     Mental Status: She is alert and oriented to person, place, and time.  Psychiatric:        Mood and Affect: Mood normal.   MAU Course  Procedures None  MDM  Quant 8/6: 634 O positive blood type Reviewed [place] and [place] results with patient in detail This is not a healthy growing pregnancy. We cannot officially rule out an ectopic. She is unable to come to the office for a stat quant d/t work on 8/10 Toradol 60 mg given IM  Assessment and Plan   A:  1. Threatened miscarriage   2. Vaginal bleeding during pregnancy   3. Pregnancy of unknown anatomic location      P:  Discharge home in stable condition Return to MAU on 8/10 for stat quant- conflict d/t work schedule Bleeding precautions.  Rx: Ibuprofen Pelvic rest   Katrina Daddona, Harolyn Rutherford, NP 05/26/2021 3:08 PM

## 2021-05-24 NOTE — Discharge Instructions (Signed)
Return to MAU on Wednesday, August 10 after 5 for repeat hormone level check.  Plan to f/u with your primary OB/GYN  later to reassess ? Mass; when this is resolved.

## 2021-05-24 NOTE — MAU Note (Signed)
Pt reports she started spotting on Wed  got heavier with cramping. [facility]. Told she saw a sac but no heartbeat. Today having more  small clots and cramping.

## 2021-05-27 ENCOUNTER — Ambulatory Visit: Payer: Medicaid Other

## 2021-06-09 NOTE — Progress Notes (Signed)
Pt was no show

## 2021-06-24 ENCOUNTER — Other Ambulatory Visit: Payer: Self-pay | Admitting: Obstetrics and Gynecology

## 2021-06-24 DIAGNOSIS — N6452 Nipple discharge: Secondary | ICD-10-CM

## 2021-06-25 ENCOUNTER — Ambulatory Visit
Admission: RE | Admit: 2021-06-25 | Discharge: 2021-06-25 | Disposition: A | Discharge status: Home / Self Care | Payer: Medicaid Other | Source: Ambulatory Visit | Attending: Obstetrics and Gynecology | Admitting: Obstetrics and Gynecology

## 2021-06-25 ENCOUNTER — Other Ambulatory Visit: Payer: Self-pay

## 2021-06-25 ENCOUNTER — Other Ambulatory Visit: Payer: Self-pay | Admitting: Obstetrics and Gynecology

## 2021-06-25 DIAGNOSIS — N6452 Nipple discharge: Secondary | ICD-10-CM

## 2021-06-29 ENCOUNTER — Other Ambulatory Visit: Payer: Self-pay | Admitting: Obstetrics and Gynecology

## 2021-07-23 DIAGNOSIS — R1900 Intra-abdominal and pelvic swelling, mass and lump, unspecified site: Secondary | ICD-10-CM | POA: Diagnosis present

## 2021-07-23 NOTE — H&P (Addendum)
Tanya Perez is a 35 y.o.  female P: 1-0-2-1 who presents for removal of a left pelvic mass that is believed to originate from her ovary.  In August 2022 the patient was being evaluated at [facility] ED for miscarriage  The ultrasound showed an 8.4 cm homogeneous hypoechoic left ovarian mass with internal echoes, compatible with an endometrioma. The patient denies pelvic pain though some days her abdomen feels tight.  This sensation is random and may last for a hour at a time. She denies any triggers or aggravating activity that relates to this occurrence.  She has not had any changes in urination, vaginitis symptoms, problems with bowel function  and on;y occasional dyspareunia.  Her menstrual periods or regular, lasting 5 days with the change of a regular pad every 2 hours.  She admits to cramping that she rates as 8/10 but only uses heat for relief.   Given the findings on ultrasound along with the size and complexity of the mass, the patient has consented to removal of the mass.  Past Medical History  OB History: G: 3; P: 1-0-2-1;  SVB 09/12/2011  GYN History: menarche: 35 YO;  LMP: 07/15/2021;  Contraception: none;   Denies history of abnormal PAP smear that was repeated and returned normal.  Last PAP smear-2021 normal  Medical History:  negative  Surgical History: none Denies history of blood transfusions.  Family History: Diabetes Mellitus and Hypertension  Social History: Single and employed by [employer]; Smokes 3 cigarettes a day and occasionally consumes alcohol  Medications: NONE  No Known Allergies  ROS:  Denies  corrective lenses, dentures, headache, vision changes, nasal congestion, dysphagia, tinnitus, dizziness, hoarseness, cough,  chest pain, shortness of breath, nausea, vomiting, diarrhea,constipation,  urinary frequency, urgency  dysuria, hematuria, vaginitis symptoms, pelvic pain, swelling of joints,easy bruising,  myalgias, arthralgias, skin rashes, unexplained  weight loss and except as is mentioned in the history of present illness, patient's review of systems is otherwise negative.    Physical Exam  Bp: 122/70;  Height: 5';  Weight: 132 lbs.;  BMI: 25.8  Neck: supple without masses or thyromegaly Lungs: clear to auscultation Heart: regular rate and rhythm Abdomen: soft, non-tender and no organomegaly Pelvic:EGBUS- wnl; vagina-normal rugae; uterus-normal size, cervix without lesions or motion tenderness; adnexae-left tenderness  with palpable mass Extremities:  no clubbing, cyanosis or edema   Assesment: Complex Left Pelvic Mass   Disposition:  A discussion was held with patient regarding the indication for her procedure(s) along with the risks, which include but are not limited to: reaction to anesthesia, damage to adjacent organs, infection, excessive bleeding and the possible need for an open abdominal incision.  The patient verbalized understanding of these risks and has consented to proceed with an Operative Laparoscopy, Possible Laparotomy for removal of a left pelvic mass at [facility] on August 12, 2021.   CSN# [id number]   [place] J. Lowell Guitar, PA-C  for Dr. Pierre Bali. Margi Edmundson Date of Initial H&P: 07/23/21  History reviewed, patient examined, no change in status, stable for surgery.

## 2021-07-30 ENCOUNTER — Ambulatory Visit (HOSPITAL_COMMUNITY)
Admission: RE | Admit: 2021-07-30 | Discharge: 2021-07-30 | Disposition: A | Discharge status: Home / Self Care | Payer: Medicaid Other | Source: Ambulatory Visit | Attending: Urgent Care | Admitting: Urgent Care

## 2021-07-30 ENCOUNTER — Other Ambulatory Visit: Payer: Self-pay

## 2021-07-30 ENCOUNTER — Encounter (HOSPITAL_COMMUNITY): Payer: Self-pay

## 2021-07-30 VITALS — BP 126/81 | HR 63 | Temp 98.6°F | Resp 18

## 2021-07-30 DIAGNOSIS — L729 Follicular cyst of the skin and subcutaneous tissue, unspecified: Secondary | ICD-10-CM

## 2021-07-30 DIAGNOSIS — L089 Local infection of the skin and subcutaneous tissue, unspecified: Secondary | ICD-10-CM | POA: Diagnosis not present

## 2021-07-30 MED ORDER — DOXYCYCLINE HYCLATE 100 MG PO CAPS: 100.0000 mg | ORAL_CAPSULE | Freq: Two times a day (BID) | ORAL | 0 refills | Status: AC

## 2021-07-30 MED ORDER — NAPROXEN 500 MG PO TABS: 500.0000 mg | ORAL_TABLET | Freq: Two times a day (BID) | ORAL | 0 refills | Status: DC

## 2021-07-30 MED ORDER — LIDOCAINE-EPINEPHRINE 1 %-1:100000 IJ SOLN
INTRAMUSCULAR | Status: AC
  Filled 2021-07-30: qty 1

## 2021-07-30 NOTE — ED Provider Notes (Signed)
  [facility]   MRN: [id number] DOB: Jun 29, 1986  Subjective:   Tanya Perez is a 35 y.o. female presenting for 1 week history of persistent recurrent worsening right axillary arm pain with swelling.  Patient has had a previous I&D here.  Denies fever, nausea, vomiting, radiation of the pain.  No current facility-administered medications for this encounter.  Current Outpatient Medications:    ibuprofen (ADVIL) 800 MG tablet, Take 1 tablet (800 mg total) by mouth every 8 (eight) hours as needed. (Patient not taking: Reported on 07/29/2021), Disp: 30 tablet, Rfl: 0   No Known Allergies  History reviewed. No pertinent past medical history.   History reviewed. No pertinent surgical history.  Family History  Problem Relation Age of Onset   Diabetes Mother    Healthy Father     Social History   Tobacco Use   Smoking status: Some Days    Packs/day: 0.10    Types: Cigarettes   Smokeless tobacco: Never  Vaping Use   Vaping Use: Never used  Substance Use Topics   Alcohol use: Not Currently   Drug use: Not Currently    Comment: about a month ago    ROS   Objective:   Vitals: BP 126/81 (BP Location: Left Arm)   Pulse 63   Temp 98.6 F (37 C) (Oral)   Resp 18   LMP 07/04/2021 (Approximate)   SpO2 99%   Physical Exam Constitutional:      General: She is not in acute distress.    Appearance: Normal appearance. She is well-developed. She is not ill-appearing, toxic-appearing or diaphoretic.  HENT:     Head: Normocephalic and atraumatic.     Right Ear: External ear normal.     Left Ear: External ear normal.     Nose: Nose normal.     Mouth/Throat:     Mouth: Mucous membranes are moist.     Pharynx: Oropharynx is clear.  Eyes:     General: No scleral icterus.    Extraocular Movements: Extraocular movements intact.     Pupils: Pupils are equal, round, and reactive to light.  Cardiovascular:     Rate and Rhythm: Normal rate.  Pulmonary:      Effort: Pulmonary effort is normal.  Skin:    General: Skin is warm and dry.       Neurological:     General: No focal deficit present.     Mental Status: She is alert and oriented to person, place, and time.  Psychiatric:        Mood and Affect: Mood normal.        Behavior: Behavior normal.    PROCEDURE NOTE: I&D of Abscess Verbal consent obtained. Local anesthesia with 2cc of 1% lidocaine with epinephrine. Site cleansed with Betadine. Incision of 1/2cm was made using an 11 blade, 3cc expressed consisting of a mixture of pus and serosanguinous fluid and sebaceous material. Wound cavity was explored with curved hemostats and loculations loosened. Cleansed and dressed.   Assessment and Plan :   PDMP not reviewed this encounter.  1. Infected cyst of skin     Infected sebaceous cyst successfully drained and sac material removed.  Start doxycycline for the infection, naproxen for pain and inflammation.  Wound care reviewed.  Follow-up with dermatology as this can recur. Counseled patient on potential for adverse effects with medications prescribed/recommended today, ER and return-to-clinic precautions discussed, patient verbalized understanding.    Wallis Bamberg, PA-C 07/30/21 1211

## 2021-07-30 NOTE — Discharge Instructions (Addendum)
Please change your dressing 3-5 times daily. Do not apply any ointments or creams. Each time you change your dressing, make sure that you are pressing on the wound to get pus to come out.  Try your best to have a family member help you clean gently around the perimeter of the wound with gentle soap and warm water. Pat your wound dry and let it air out if possible to make sure it is dry before reapplying another dressing.   

## 2021-07-30 NOTE — ED Triage Notes (Signed)
Pt presents with abscess under right arm X 1 week. 

## 2021-08-03 NOTE — Progress Notes (Signed)
Surgical Instructions    Your procedure is scheduled on Thursday, October 27th, 2022.   Report to [facility] Main Entrance "A" at 06:45 A.M., then check in with the Admitting office.  Call this number if you have problems the morning of surgery:  [phone]   If you have any questions prior to your surgery date call [phone]: Open Monday-Friday 8am-4pm    Remember:  Do not eat or drink after midnight the night before your surgery    Take these medicines the morning of surgery with A SIP OF WATER:  doxycycline (VIBRAMYCIN)   As of today, STOP taking any Aspirin (unless otherwise instructed by your surgeon) Aleve, Naproxen, Ibuprofen, Motrin, Advil, Goody's, BC's, all herbal medications, fish oil, and all vitamins.   After your COVID test   You are not required to quarantine however you are required to wear a well-fitting mask when you are out and around people not in your household.  If your mask becomes wet or soiled, replace with a new one.  Wash your hands often with soap and water for 20 seconds or clean your hands with an alcohol-based hand sanitizer that contains at least 60% alcohol.  Do not share personal items.  Notify your provider: if you are in close contact with someone who has COVID  or if you develop a fever of 100.4 or greater, sneezing, cough, sore throat, shortness of breath or body aches.    The day of surgery:          Do not wear jewelry or makeup Do not wear lotions, powders, perfumes, or deodorant. Do not shave 48 hours prior to surgery.   Do not bring valuables to the hospital. DO Not wear nail polish, gel polish, artificial nails, or any other type of covering on natural nails including finger and toenails. If patients have artificial nails, gel coating, etc. that need to be removed by a nail salon, please have this removed prior to surgery or surgery may need to be canceled/delayed if the surgeon/ anesthesia feels like the patient is unable to  be adequately monitored.              [facility] is not responsible for any belongings or valuables.  Do NOT Smoke (Tobacco/Vaping)  24 hours prior to your procedure  If you use a CPAP at night, you may bring your mask for your overnight stay.   Contacts, glasses, hearing aids, dentures or partials may not be worn into surgery, please bring cases for these belongings   For patients admitted to the hospital, discharge time will be determined by your treatment team.   Patients discharged the day of surgery will not be allowed to drive home, and someone needs to stay with them for 24 hours.  NO VISITORS WILL BE ALLOWED IN PRE-OP WHERE PATIENTS ARE PREPPED FOR SURGERY.  ONLY 1 SUPPORT PERSON MAY BE PRESENT IN THE WAITING ROOM WHILE YOU ARE IN SURGERY.  IF YOU ARE TO BE ADMITTED, ONCE YOU ARE IN YOUR ROOM YOU WILL BE ALLOWED TWO (2) VISITORS. 1 (ONE) VISITOR MAY STAY OVERNIGHT BUT MUST ARRIVE TO THE ROOM BY 8pm.  Minor children may have two parents present. Special consideration for safety and communication needs will be reviewed on a case by case basis.  Special instructions:    Oral Hygiene is also important to reduce your risk of infection.  Remember - BRUSH YOUR TEETH THE MORNING OF SURGERY WITH YOUR REGULAR TOOTHPASTE   [facility]- Preparing  For Surgery  Before surgery, you can play an important role. Because skin is not sterile, your skin needs to be as free of germs as possible. You can reduce the number of germs on your skin by washing with CHG (chlorahexidine gluconate) Soap before surgery.  CHG is an antiseptic cleaner which kills germs and bonds with the skin to continue killing germs even after washing.     Please do not use if you have an allergy to CHG or antibacterial soaps. If your skin becomes reddened/irritated stop using the CHG.  Do not shave (including legs and underarms) for at least 48 hours prior to first CHG shower. It is OK to shave your face.  Please follow these  instructions carefully.     Shower the NIGHT BEFORE SURGERY and the MORNING OF SURGERY with CHG Soap.   If you chose to wash your hair, wash your hair first as usual with your normal shampoo. After you shampoo, rinse your hair and body thoroughly to remove the shampoo.  Then [employer] and genitals (private parts) with your normal soap and rinse thoroughly to remove soap.  After that Use CHG Soap as you would any other liquid soap. You can apply CHG directly to the skin and wash gently with a scrungie or a clean washcloth.   Apply the CHG Soap to your body ONLY FROM THE NECK DOWN.  Do not use on open wounds or open sores. Avoid contact with your eyes, ears, mouth and genitals (private parts). Wash Face and genitals (private parts)  with your normal soap.   Wash thoroughly, paying special attention to the area where your surgery will be performed.  Thoroughly rinse your body with warm water from the neck down.  DO NOT shower/wash with your normal soap after using and rinsing off the CHG Soap.  Pat yourself dry with a CLEAN TOWEL.  Wear CLEAN PAJAMAS to bed the night before surgery  Place CLEAN SHEETS on your bed the night before your surgery  DO NOT SLEEP WITH PETS.   Day of Surgery:  Take a shower with CHG soap. Wear Clean/Comfortable clothing the morning of surgery Do not apply any deodorants/lotions.   Remember to brush your teeth WITH YOUR REGULAR TOOTHPASTE.   Please read over the following fact sheets that you were given.

## 2021-08-04 ENCOUNTER — Encounter (HOSPITAL_COMMUNITY)
Admission: RE | Admit: 2021-08-04 | Discharge: 2021-08-04 | Disposition: A | Discharge status: Home / Self Care | Payer: Medicaid Other | Source: Ambulatory Visit | Attending: Obstetrics and Gynecology | Admitting: Obstetrics and Gynecology

## 2021-08-04 ENCOUNTER — Encounter (HOSPITAL_COMMUNITY): Payer: Self-pay

## 2021-08-04 ENCOUNTER — Other Ambulatory Visit: Payer: Self-pay

## 2021-08-04 VITALS — BP 118/84 | HR 72 | Temp 98.4°F | Resp 17 | Ht 60.0 in | Wt 131.3 lb

## 2021-08-04 DIAGNOSIS — R1900 Intra-abdominal and pelvic swelling, mass and lump, unspecified site: Secondary | ICD-10-CM | POA: Diagnosis not present

## 2021-08-04 DIAGNOSIS — Z01812 Encounter for preprocedural laboratory examination: Secondary | ICD-10-CM | POA: Insufficient documentation

## 2021-08-04 LAB — CBC
HCT: 39.6 % (ref 36.0–46.0)
Hemoglobin: 13.1 g/dL (ref 12.0–15.0)
MCH: 31.4 pg (ref 26.0–34.0)
MCHC: 33.1 g/dL (ref 30.0–36.0)
MCV: 95 fL (ref 80.0–100.0)
Platelets: 202 10*3/uL (ref 150–400)
RBC: 4.17 MIL/uL (ref 3.87–5.11)
RDW: 11.9 % (ref 11.5–15.5)
WBC: 5.6 10*3/uL (ref 4.0–10.5)
nRBC: 0 % (ref 0.0–0.2)

## 2021-08-04 NOTE — Progress Notes (Signed)
Your procedure is scheduled on Thursday 10/27.  Report to [facility] Main Entrance "A" at 06:45 A.M., and check in at the Admitting office.  Call this number if you have problems the morning of surgery: [phone]  Call [phone] if you have any questions prior to your surgery date Monday-Friday 8am-4pm   Remember: Do not eat after midnight the night before your surgery  You may drink clear liquids until 05:45 AM the morning of your surgery.   Clear liquids allowed are: Water, Non-Citrus Juices (without pulp), Carbonated Beverages, Clear Tea, Black Coffee Only, and Gatorade   Take these medicines the morning of surgery with A SIP OF WATER: doxycycline (VIBRAMYCIN)    As of today, STOP taking any Aspirin (unless otherwise instructed by your surgeon), Aleve, Naproxen, Ibuprofen, Motrin, Advil, Goody's, BC's, all herbal medications, fish oil, and all vitamins.    The Morning of Surgery  Do not wear jewelry, make-up or nail polish.  Do not wear lotions, powders, or perfumes/colognes, or deodorant  Do not shave 48 hours prior to surgery.   Men may shave face and neck.  Do not bring valuables to the hospital.  [facility] is not responsible for any belongings or valuables.  If you are a smoker, DO NOT Smoke 24 hours prior to surgery  If you wear a CPAP at night please bring your mask the morning of surgery   Remember that you must have someone to transport you home after your surgery, and remain with you for 24 hours if you are discharged the same day.   Please bring cases for contacts, glasses, hearing aids, dentures or bridgework because it cannot be worn into surgery.    Leave your suitcase in the car.  After surgery it may be brought to your room.  For patients admitted to the hospital, discharge time will be determined by your treatment team.  Patients discharged the day of surgery will not be allowed to drive home.    Special instructions:   [facility]-  Preparing For Surgery  Before surgery, you can play an important role. Because skin is not sterile, your skin needs to be as free of germs as possible. You can reduce the number of germs on your skin by washing with CHG (chlorahexidine gluconate) Soap before surgery.  CHG is an antiseptic cleaner which kills germs and bonds with the skin to continue killing germs even after washing.    Oral Hygiene is also important to reduce your risk of infection.  Remember - BRUSH YOUR TEETH THE MORNING OF SURGERY WITH YOUR REGULAR TOOTHPASTE  Please do not use if you have an allergy to CHG or antibacterial soaps. If your skin becomes reddened/irritated stop using the CHG.  Do not shave (including legs and underarms) for at least 48 hours prior to first CHG shower. It is OK to shave your face.  Please follow these instructions carefully.   Shower the NIGHT BEFORE SURGERY and the MORNING OF SURGERY with CHG Soap.   If you chose to wash your hair and body, wash as usual with your normal shampoo and body-wash/soap.  Rinse your hair and body thoroughly to remove the shampoo and soap.  Apply CHG directly to the skin (ONLY FROM THE NECK DOWN) and wash gently with a scrungie or a clean washcloth.   Do not use on open wounds or open sores. Avoid contact with your eyes, ears, mouth and genitals (private parts). Wash Face and genitals (private parts)  with your  normal soap.   Wash thoroughly, paying special attention to the area where your surgery will be performed.  Thoroughly rinse your body with warm water from the neck down.  DO NOT shower/wash with your normal soap after using and rinsing off the CHG Soap.  Pat yourself dry with a CLEAN TOWEL.  Wear CLEAN PAJAMAS to bed the night before surgery  Place CLEAN SHEETS on your bed the night of your first shower and DO NOT SLEEP WITH PETS.  Wear comfortable clothes the morning of surgery.     Day of Surgery:  Please shower the morning of surgery with  the CHG soap Do not apply any deodorants/lotions. Please wear clean clothes to the hospital/surgery center.   Remember to brush your teeth WITH YOUR REGULAR TOOTHPASTE.  NO VISITORS WILL BE ALLOWED IN PRE-OP WHERE PATIENTS ARE PREPPED FOR SURGERY.  ONLY 1 SUPPORT PERSON MAY BE PRESENT IN THE WAITING ROOM WHILE YOU ARE IN SURGERY.  IF YOU ARE TO BE ADMITTED, ONCE YOU ARE IN YOUR ROOM YOU WILL BE ALLOWED TWO (2) VISITORS. 1 (ONE) VISITOR MAY STAY OVERNIGHT BUT MUST ARRIVE TO THE ROOM BY 8pm.  Minor children may have two parents present. Special consideration for safety and communication needs will be reviewed on a case by case basis.     Please read over the following fact sheets that you were given.

## 2021-08-04 NOTE — Progress Notes (Signed)
PCP - denies Cardiologist - denies    ERAS Protcol - n/a - clears 05:45 PRE-SURGERY Ensure or G2-   COVID TEST- n/a   Anesthesia review: n/a  Patient denies shortness of breath, fever, cough and chest pain at PAT appointment   All instructions explained to the patient, with a verbal understanding of the material. Patient agrees to go over the instructions while at home for a better understanding. Patient also instructed to self quarantine after being tested for COVID-19. The opportunity to ask questions was provided.

## 2021-08-12 ENCOUNTER — Other Ambulatory Visit: Payer: Self-pay

## 2021-08-12 ENCOUNTER — Encounter (HOSPITAL_COMMUNITY): Payer: Self-pay | Admitting: Obstetrics and Gynecology

## 2021-08-12 ENCOUNTER — Ambulatory Visit (HOSPITAL_COMMUNITY): Payer: Medicaid Other | Admitting: Certified Registered Nurse Anesthetist

## 2021-08-12 ENCOUNTER — Encounter (HOSPITAL_COMMUNITY)
Admission: RE | Disposition: A | Discharge status: Home / Self Care | Payer: Self-pay | Source: Home / Self Care | Attending: Obstetrics and Gynecology

## 2021-08-12 ENCOUNTER — Ambulatory Visit (HOSPITAL_COMMUNITY)
Admission: RE | Admit: 2021-08-12 | Discharge: 2021-08-12 | Disposition: A | Discharge status: Home / Self Care | Payer: Medicaid Other | Attending: Obstetrics and Gynecology | Admitting: Obstetrics and Gynecology

## 2021-08-12 DIAGNOSIS — N83202 Unspecified ovarian cyst, left side: Secondary | ICD-10-CM | POA: Diagnosis not present

## 2021-08-12 DIAGNOSIS — N80129 Deep endometriosis of ovary, unspecified ovary: Secondary | ICD-10-CM

## 2021-08-12 DIAGNOSIS — F17210 Nicotine dependence, cigarettes, uncomplicated: Secondary | ICD-10-CM | POA: Diagnosis not present

## 2021-08-12 DIAGNOSIS — R1900 Intra-abdominal and pelvic swelling, mass and lump, unspecified site: Secondary | ICD-10-CM | POA: Diagnosis present

## 2021-08-12 HISTORY — PX: LAPAROTOMY: SHX154

## 2021-08-12 HISTORY — PX: OVARIAN CYST REMOVAL: SHX89

## 2021-08-12 LAB — BASIC METABOLIC PANEL
Anion gap: 7 (ref 5–15)
BUN: 10 mg/dL (ref 6–20)
CO2: 23 mmol/L (ref 22–32)
Calcium: 8.8 mg/dL — ABNORMAL LOW (ref 8.9–10.3)
Chloride: 106 mmol/L (ref 98–111)
Creatinine, Ser: 0.74 mg/dL (ref 0.44–1.00)
GFR, Estimated: >60 mL/min (ref >60)
Glucose, Bld: 92 mg/dL (ref 70–99)
Potassium: 4.2 mmol/L (ref 3.5–5.1)
Sodium: 136 mmol/L (ref 135–145)

## 2021-08-12 LAB — POCT PREGNANCY, URINE: Preg Test, Ur: NEGATIVE

## 2021-08-12 SURGERY — EXCISION, CYST, OVARY
Anesthesia: General | Site: Abdomen

## 2021-08-12 MED ORDER — MIDAZOLAM HCL 5 MG/5ML IJ SOLN
INTRAMUSCULAR | Status: DC | PRN
Start: 2021-08-12 — End: 2021-08-12
  Administered 2021-08-12 (×2): 1 mg via INTRAVENOUS

## 2021-08-12 MED ORDER — POVIDONE-IODINE 10 % EX SWAB
2.0000 "application " | Freq: Once | CUTANEOUS | Status: AC
  Administered 2021-08-12: 2 via TOPICAL

## 2021-08-12 MED ORDER — KETOROLAC TROMETHAMINE 30 MG/ML IJ SOLN
INTRAMUSCULAR | Status: AC
  Filled 2021-08-12: qty 1

## 2021-08-12 MED ORDER — CHLORHEXIDINE GLUCONATE 0.12 % MT SOLN
15.0000 mL | Freq: Once | OROMUCOSAL | Status: AC
  Administered 2021-08-12: 15 mL via OROMUCOSAL
  Filled 2021-08-12: qty 15

## 2021-08-12 MED ORDER — ONDANSETRON HCL 4 MG/2ML IJ SOLN
INTRAMUSCULAR | Status: AC
  Filled 2021-08-12: qty 2

## 2021-08-12 MED ORDER — DEXAMETHASONE SODIUM PHOSPHATE 10 MG/ML IJ SOLN
INTRAMUSCULAR | Status: DC | PRN
  Administered 2021-08-12: 10 mg via INTRAVENOUS

## 2021-08-12 MED ORDER — PROPOFOL 10 MG/ML IV BOLUS
INTRAVENOUS | Status: DC | PRN
  Administered 2021-08-12: 150 mg via INTRAVENOUS

## 2021-08-12 MED ORDER — MIDAZOLAM HCL 2 MG/2ML IJ SOLN
INTRAMUSCULAR | Status: AC
  Filled 2021-08-12: qty 2

## 2021-08-12 MED ORDER — PROPOFOL 500 MG/50ML IV EMUL: INTRAVENOUS | Status: DC | PRN

## 2021-08-12 MED ORDER — FENTANYL CITRATE (PF) 250 MCG/5ML IJ SOLN
INTRAMUSCULAR | Status: AC
  Filled 2021-08-12: qty 5

## 2021-08-12 MED ORDER — OXYCODONE-ACETAMINOPHEN 5-325 MG PO TABS: ORAL_TABLET | ORAL | 0 refills | Status: AC

## 2021-08-12 MED ORDER — ROCURONIUM BROMIDE 10 MG/ML (PF) SYRINGE
PREFILLED_SYRINGE | INTRAVENOUS | Status: DC | PRN
Start: 2021-08-12 — End: 2021-08-12
  Administered 2021-08-12: 10 mg via INTRAVENOUS
  Administered 2021-08-12: 50 mg via INTRAVENOUS

## 2021-08-12 MED ORDER — ONDANSETRON HCL 4 MG/2ML IJ SOLN
INTRAMUSCULAR | Status: DC | PRN
  Administered 2021-08-12: 4 mg via INTRAVENOUS

## 2021-08-12 MED ORDER — SUGAMMADEX SODIUM 200 MG/2ML IV SOLN
INTRAVENOUS | Status: DC | PRN
  Administered 2021-08-12: 237.6 mg via INTRAVENOUS

## 2021-08-12 MED ORDER — ORAL CARE MOUTH RINSE: 15.0000 mL | Freq: Once | OROMUCOSAL | Status: AC

## 2021-08-12 MED ORDER — IBUPROFEN 600 MG PO TABS: ORAL_TABLET | ORAL | 0 refills | Status: AC

## 2021-08-12 MED ORDER — BUPIVACAINE HCL (PF) 0.5 % IJ SOLN
INTRAMUSCULAR | Status: DC | PRN
  Administered 2021-08-12: 10 mL

## 2021-08-12 MED ORDER — CEFAZOLIN SODIUM-DEXTROSE 2-4 GM/100ML-% IV SOLN
2.0000 g | INTRAVENOUS | Status: AC
  Administered 2021-08-12: 2 g via INTRAVENOUS
  Filled 2021-08-12: qty 100

## 2021-08-12 MED ORDER — 0.9 % SODIUM CHLORIDE (POUR BTL) OPTIME
TOPICAL | Status: DC | PRN
  Administered 2021-08-12: 1000 mL

## 2021-08-12 MED ORDER — HYDROMORPHONE HCL 1 MG/ML IJ SOLN: 0.2500 mg | INTRAMUSCULAR | Status: DC | PRN

## 2021-08-12 MED ORDER — PHENYLEPHRINE HCL-NACL 20-0.9 MG/250ML-% IV SOLN
INTRAVENOUS | Status: DC | PRN
  Administered 2021-08-12: 20 ug/min via INTRAVENOUS

## 2021-08-12 MED ORDER — ROCURONIUM BROMIDE 10 MG/ML (PF) SYRINGE
PREFILLED_SYRINGE | INTRAVENOUS | Status: AC
  Filled 2021-08-12: qty 10

## 2021-08-12 MED ORDER — SUCCINYLCHOLINE CHLORIDE 200 MG/10ML IV SOSY
PREFILLED_SYRINGE | INTRAVENOUS | Status: AC
  Filled 2021-08-12: qty 10

## 2021-08-12 MED ORDER — PROPOFOL 500 MG/50ML IV EMUL
INTRAVENOUS | Status: DC | PRN
  Administered 2021-08-12: 20 ug/kg/min via INTRAVENOUS

## 2021-08-12 MED ORDER — BUPIVACAINE HCL (PF) 0.5 % IJ SOLN
INTRAMUSCULAR | Status: AC
  Filled 2021-08-12: qty 30

## 2021-08-12 MED ORDER — LACTATED RINGERS IV SOLN: INTRAVENOUS | Status: DC

## 2021-08-12 MED ORDER — SODIUM CHLORIDE 0.9 % IR SOLN
Status: DC | PRN
  Administered 2021-08-12: 1000 mL

## 2021-08-12 MED ORDER — DEXAMETHASONE SODIUM PHOSPHATE 10 MG/ML IJ SOLN
INTRAMUSCULAR | Status: AC
  Filled 2021-08-12: qty 1

## 2021-08-12 MED ORDER — KETOROLAC TROMETHAMINE 15 MG/ML IJ SOLN
INTRAMUSCULAR | Status: DC | PRN
  Administered 2021-08-12: 15 mg via INTRAVENOUS

## 2021-08-12 MED ORDER — ACETAMINOPHEN 10 MG/ML IV SOLN
INTRAVENOUS | Status: DC | PRN
  Administered 2021-08-12: 1000 mg via INTRAVENOUS

## 2021-08-12 MED ORDER — LIDOCAINE 2% (20 MG/ML) 5 ML SYRINGE
INTRAMUSCULAR | Status: DC | PRN
Start: 2021-08-12 — End: 2021-08-12
  Administered 2021-08-12: 100 mg via INTRAVENOUS

## 2021-08-12 MED ORDER — FENTANYL CITRATE (PF) 250 MCG/5ML IJ SOLN
INTRAMUSCULAR | Status: DC | PRN
  Administered 2021-08-12 (×3): 50 ug via INTRAVENOUS

## 2021-08-12 MED ORDER — LIDOCAINE 2% (20 MG/ML) 5 ML SYRINGE
INTRAMUSCULAR | Status: AC
  Filled 2021-08-12: qty 5

## 2021-08-12 SURGICAL SUPPLY — 61 items
BAG COUNTER SPONGE SURGICOUNT (BAG) ×2 IMPLANT
BLADE SURG 10 STRL SS (BLADE) ×4 IMPLANT
CANISTER SUCT 3000ML PPV (MISCELLANEOUS) ×2 IMPLANT
CATH FOLEY 3WAY  5CC 16FR (CATHETERS)
CATH FOLEY 3WAY 5CC 16FR (CATHETERS) IMPLANT
DECANTER SPIKE VIAL GLASS SM (MISCELLANEOUS) IMPLANT
DRAPE WARM FLUID 44X44 (DRAPES) IMPLANT
DRSG OPSITE POSTOP 4X10 (GAUZE/BANDAGES/DRESSINGS) ×2 IMPLANT
DURAPREP 26ML APPLICATOR (WOUND CARE) ×2 IMPLANT
ELECT CAUTERY BLADE 6.4 (BLADE) IMPLANT
GAUZE 4X4 16PLY ~~LOC~~+RFID DBL (SPONGE) IMPLANT
GLOVE SURG ENC MOIS LTX SZ6.5 (GLOVE) ×2 IMPLANT
GLOVE SURG UNDER POLY LF SZ7 (GLOVE) ×8 IMPLANT
GOWN STRL REUS W/ TWL LRG LVL3 (GOWN DISPOSABLE) ×2 IMPLANT
GOWN STRL REUS W/TWL LRG LVL3 (GOWN DISPOSABLE) ×2
HEMOSTAT ARISTA ABSORB 3G PWDR (HEMOSTASIS) ×2 IMPLANT
HIBICLENS CHG 4% 4OZ BTL (MISCELLANEOUS) ×2 IMPLANT
IRRIG SUCT STRYKERFLOW 2 WTIP (MISCELLANEOUS) ×2
IRRIGATION SUCT STRKRFLW 2 WTP (MISCELLANEOUS) ×1 IMPLANT
KIT TURNOVER KIT B (KITS) ×2 IMPLANT
NEEDLE HYPO 22GX1.5 SAFETY (NEEDLE) IMPLANT
NS IRRIG 1000ML POUR BTL (IV SOLUTION) ×2 IMPLANT
PACK ABDOMINAL GYN (CUSTOM PROCEDURE TRAY) ×2 IMPLANT
PAD ARMBOARD 7.5X6 YLW CONV (MISCELLANEOUS) ×2 IMPLANT
PAD OB MATERNITY 4.3X12.25 (PERSONAL CARE ITEMS) ×2 IMPLANT
PENCIL BUTTON HOLSTER BLD 10FT (ELECTRODE) ×2 IMPLANT
PROTECTOR NERVE ULNAR (MISCELLANEOUS) ×2 IMPLANT
SET CYSTO W/LG BORE CLAMP LF (SET/KITS/TRAYS/PACK) IMPLANT
SET TUBE SMOKE EVAC HIGH FLOW (TUBING) ×2 IMPLANT
SHEET LAVH (DRAPES) IMPLANT
SOL ANTI FOG 6CC (MISCELLANEOUS) ×1 IMPLANT
SOLUTION ANTI FOG 6CC (MISCELLANEOUS) ×1
SPECIMEN JAR MEDIUM (MISCELLANEOUS) ×2 IMPLANT
SPONGE INTESTINAL PEANUT (DISPOSABLE) ×2 IMPLANT
SPONGE SURGIFOAM ABS GEL 12-7 (HEMOSTASIS) IMPLANT
SPONGE T-LAP 18X18 ~~LOC~~+RFID (SPONGE) ×4 IMPLANT
STAPLER VISISTAT 35W (STAPLE) ×2 IMPLANT
STRIP CLOSURE SKIN 1/2X4 (GAUZE/BANDAGES/DRESSINGS) ×2 IMPLANT
SUT CHROMIC 0 CT 1 (SUTURE) IMPLANT
SUT CHROMIC 2 0 CT 1 (SUTURE) IMPLANT
SUT MNCRL AB 3-0 PS2 18 (SUTURE) ×2 IMPLANT
SUT MNCRL AB 3-0 PS2 27 (SUTURE) IMPLANT
SUT MON AB 3-0 SH 27 (SUTURE)
SUT MON AB 3-0 SH27 (SUTURE) IMPLANT
SUT PDS AB 0 CT 36 (SUTURE) IMPLANT
SUT PDS AB 0 CT1 27 (SUTURE) IMPLANT
SUT PLAIN 2 0 XLH (SUTURE) ×2 IMPLANT
SUT VIC AB 0 CT1 18XCR BRD8 (SUTURE) IMPLANT
SUT VIC AB 0 CT1 27 (SUTURE) ×2
SUT VIC AB 0 CT1 27XBRD ANBCTR (SUTURE) ×2 IMPLANT
SUT VIC AB 0 CT1 8-18 (SUTURE)
SUT VIC AB 2-0 SH 27 (SUTURE) ×1
SUT VIC AB 2-0 SH 27XBRD (SUTURE) ×1 IMPLANT
SUT VICRYL 0 TIES 12 18 (SUTURE) IMPLANT
SUT VICRYL 0 UR6 27IN ABS (SUTURE) ×4 IMPLANT
SYR 50ML LL SCALE MARK (SYRINGE) IMPLANT
SYR CONTROL 10ML LL (SYRINGE) IMPLANT
TOWEL GREEN STERILE FF (TOWEL DISPOSABLE) ×4 IMPLANT
TRAY FOLEY W/BAG SLVR 14FR (SET/KITS/TRAYS/PACK) ×2 IMPLANT
TROCAR BALLN 12MMX100 BLUNT (TROCAR) ×2 IMPLANT
TROCAR XCEL NON-BLD 5MMX100MML (ENDOMECHANICALS) ×2 IMPLANT

## 2021-08-12 NOTE — Discharge Instructions (Signed)
Call [facility] @ [phone] if:  You have a temperature greater than or equal to 100.4 degrees Farenheit orally You have pain that is not made better by the pain medication given and taken as directed You have excessive bleeding or problems urinating  Take Colace (Docusate Sodium/Stool Softener) 100 mg 2-3 times daily while taking narcotic pain medicine to avoid constipation or until bowel movements are regular. Take Ibuprofen 600 mg with food every 6 hours for 5 days then as needed for pain  (first does should be 4:30 pm on the day of surgery)  You may drive after 24 hours You may walk up steps  You may shower tomorrow You may resume a regular diet  Keep incisions clean and dry Do not lift over 15 pounds for 6 weeks Avoid anything in vagina  until after your post-operative visit

## 2021-08-12 NOTE — Anesthesia Preprocedure Evaluation (Addendum)
Anesthesia Evaluation  Patient identified by MRN, date of birth, ID band Patient awake    Reviewed: Allergy & Precautions, NPO status , Patient's Chart, lab work & pertinent test results  Airway Mallampati: II  TM Distance: >3 FB     Dental   Pulmonary Current Smoker and Patient abstained from smoking.,    breath sounds clear to auscultation       Cardiovascular negative cardio ROS   Rhythm:Regular Rate:Normal     Neuro/Psych    GI/Hepatic negative GI ROS,   Endo/Other  negative endocrine ROS  Renal/GU negative Renal ROS     Musculoskeletal   Abdominal   Peds  Hematology   Anesthesia Other Findings   Reproductive/Obstetrics                            Anesthesia Physical Anesthesia Plan  ASA: 2  Anesthesia Plan: General   Post-op Pain Management:    Induction: Intravenous  PONV Risk Score and Plan: 2  Airway Management Planned: LMA  Additional Equipment:   Intra-op Plan:   Post-operative Plan: Extubation in OR  Informed Consent: I have reviewed the patients History and Physical, chart, labs and discussed the procedure including the risks, benefits and alternatives for the proposed anesthesia with the patient or authorized representative who has indicated his/her understanding and acceptance.     Dental advisory given  Plan Discussed with: CRNA and Anesthesiologist  Anesthesia Plan Comments:         Anesthesia Quick Evaluation

## 2021-08-12 NOTE — Transfer of Care (Signed)
Immediate Anesthesia Transfer of Care Note  Patient: Tanya Perez  Procedure(s) Performed: OVARIAN CYSTECTOMY (Abdomen) EXPLORATORY LAPAROTOMY (Abdomen)  Patient Location: PACU  Anesthesia Type:General  Level of Consciousness: drowsy, patient cooperative and responds to stimulation  Airway & Oxygen Therapy: Patient Spontanous Breathing and Patient connected to face mask oxygen  Post-op Assessment: Report given to RN, Post -op Vital signs reviewed and stable, Patient moving all extremities X 4 and Patient able to stick tongue midline  Post vital signs: stable  Last Vitals:  Vitals Value Taken Time  BP 106/68 08/12/21 1118  Temp 97.6   Pulse 93 08/12/21 1121  Resp 22 08/12/21 1121  SpO2 100 % 08/12/21 1121  Vitals shown include unvalidated device data.  Last Pain:  Vitals:   08/12/21 0716  TempSrc:   PainSc: 0-No pain         Complications: No notable events documented.

## 2021-08-12 NOTE — Anesthesia Postprocedure Evaluation (Signed)
Anesthesia Post Note  Patient: [facility]  Procedure(s) Performed: OVARIAN CYSTECTOMY (Abdomen) EXPLORATORY LAPAROTOMY (Abdomen)     Patient location during evaluation: PACU Anesthesia Type: General Level of consciousness: awake Pain management: pain level controlled Vital Signs Assessment: post-procedure vital signs reviewed and stable Respiratory status: spontaneous breathing Cardiovascular status: stable Postop Assessment: no apparent nausea or vomiting Anesthetic complications: no   No notable events documented.  Last Vitals:  Vitals:   08/12/21 1135 08/12/21 1150  BP: 117/84 123/85  Pulse: 95 74  Resp: 17 (!) 21  Temp:  (!) 36.2 C  SpO2: 100% 100%    Last Pain:  Vitals:   08/12/21 1150  TempSrc:   PainSc: 0-No pain                 Orene Abbasi

## 2021-08-12 NOTE — Anesthesia Procedure Notes (Signed)
Procedure Name: Intubation Date/Time: 08/12/2021 9:02 AM Performed by: Cy Blamer, CRNA Pre-anesthesia Checklist: Patient identified, Emergency Drugs available, Suction available and Patient being monitored Patient Re-evaluated:Patient Re-evaluated prior to induction Oxygen Delivery Method: Circle system utilized Preoxygenation: Pre-oxygenation with 100% oxygen Induction Type: IV induction Ventilation: Mask ventilation without difficulty Grade View: Grade I Tube type: Oral Tube size: 6.5 mm Number of attempts: 1 Airway Equipment and Method: Stylet Placement Confirmation: ETT inserted through vocal cords under direct vision, positive ETCO2 and breath sounds checked- equal and bilateral Secured at: 23 cm Tube secured with: Tape Dental Injury: Teeth and Oropharynx as per pre-operative assessment

## 2021-08-12 NOTE — Op Note (Signed)
Indications: Tanya Perez is a 35 y.o. female with diagnosis of multiparity.  Pre-operative Diagnosis:pelvic mass and pain  Post-operative Diagnosis: left ovarian mass c/w endometrioma  Surgeon: [id number],ERXVQ A   Assistants: Henreitta Leber PA  Anesthesia: General endotracheal anesthesia   Procedure Details  The patient was seen in the Holding Room. The risks, benefits, complications, treatment options, and expected outcomes were discussed with the patient. The possibilities of reaction to medication, pulmonary aspiration, perforation of viscus, bleeding, recurrent infection, the need for additional procedures, failure to diagnose a condition, and creating a complication requiring transfusion or operation were discussed with the patient. The patient concurred with the proposed plan, giving informed consent. The patient was taken to the Operating Room, identified as [facility] and the procedure verified as Diagnostic Laparoscopy with ovarian cystectomy    A Time Out was held and the above information confirmed.  After induction of general anesthesia, the patient was placed in modified dorsal lithotomy position where she was prepped, draped, and catheterized in the normal, sterile fashion. A foley catheter was placed..  The cervix was visualized and an intrauterine manipulator was placed. A 2 cm umbilical incision was then performed.and carried down to the fascia.  The fascia was then opened and extended bilaterally.  Peritoneum was then entered.  o vicryl was then placed around the fascia in a circumferential fashion.   The hasson was placed and ancored to the suture.. A 10 cm left ovarian mass was noted in the culdesac.  The right ovary was adherant to the posterior culdesac.  Posterior peritoneal window was seen.   The anterior  culdesac,  liver  and appendix appeared normal.   Both fallopian tubes were identified and appeared normal  Two five mm trocars were placed on the LLQ and  suprapubic under direct visualization.  The ovary was entered with scissors.  Chocolate cyst material was seen to exit out of the cyst.  This was c/w endometriosis.  The irrigator was placed in the cyst and most of th e contents were suctioned out.   The ovary was further opened with scissors and the ovarian cyst wall was removed bluntly and with scissors.  Part of the ovary was also removed due to the cyst being so large.  The ovarian cyst was made hemostatic using kleppenigers and Arista after irrigation of the entire pelvis was done.  Hemostasis was noted.  A five scope was placed in the five mm port and the cyst wall and part of the left ovary was removed from the anterior culdesac.  The PA was needed for the complexity of the case.    Following the procedure the umbilical hasson was removed after intra-abdominal carbon dioxide was expressed. The fascia was reaproximated by tying the 0 vicryl suture.   The 9mm skin incision was closed with dermabond.  The 10 mm incision was closed with a  subcuticular suture of 3-0 monocryl. The intrauterine manipulator was then removed.  The tenaculum site was hemostatic Instrument, sponge, and needle counts were correct prior to abdominal closure and at the conclusion of the case.  Findings: See above Estimated Blood Loss:  Minimal         Drains: none         Total IV Fluids:Intravenous fluids were administered, lactated Ringer's 1000 ml's.         Specimens: part of left ovary and left ovarian cyst wall.           Complications:  None; patient  tolerated the procedure well.         Disposition: PACU - hemodynamically stable.         Condition: stable

## 2021-08-13 ENCOUNTER — Encounter (HOSPITAL_COMMUNITY): Payer: Self-pay | Admitting: Obstetrics and Gynecology

## 2021-08-13 LAB — SURGICAL PATHOLOGY

## 2021-12-09 ENCOUNTER — Encounter (HOSPITAL_COMMUNITY): Payer: Self-pay

## 2021-12-09 ENCOUNTER — Other Ambulatory Visit: Payer: Self-pay

## 2021-12-09 ENCOUNTER — Emergency Department (HOSPITAL_COMMUNITY): Payer: Medicaid Other

## 2021-12-09 ENCOUNTER — Emergency Department (HOSPITAL_COMMUNITY)
Admission: EM | Admit: 2021-12-09 | Discharge: 2021-12-09 | Disposition: A | Discharge status: Home / Self Care | Payer: Medicaid Other | Attending: Emergency Medicine | Admitting: Emergency Medicine

## 2021-12-09 DIAGNOSIS — S22029A Unspecified fracture of second thoracic vertebra, initial encounter for closed fracture: Secondary | ICD-10-CM | POA: Diagnosis not present

## 2021-12-09 DIAGNOSIS — S299XXA Unspecified injury of thorax, initial encounter: Secondary | ICD-10-CM | POA: Insufficient documentation

## 2021-12-09 DIAGNOSIS — S22049A Unspecified fracture of fourth thoracic vertebra, initial encounter for closed fracture: Secondary | ICD-10-CM | POA: Insufficient documentation

## 2021-12-09 DIAGNOSIS — S3991XA Unspecified injury of abdomen, initial encounter: Secondary | ICD-10-CM | POA: Insufficient documentation

## 2021-12-09 DIAGNOSIS — S29002A Unspecified injury of muscle and tendon of back wall of thorax, initial encounter: Secondary | ICD-10-CM | POA: Diagnosis present

## 2021-12-09 DIAGNOSIS — S22039A Unspecified fracture of third thoracic vertebra, initial encounter for closed fracture: Secondary | ICD-10-CM | POA: Diagnosis not present

## 2021-12-09 DIAGNOSIS — S0990XA Unspecified injury of head, initial encounter: Secondary | ICD-10-CM | POA: Diagnosis not present

## 2021-12-09 DIAGNOSIS — S22000A Wedge compression fracture of unspecified thoracic vertebra, initial encounter for closed fracture: Secondary | ICD-10-CM

## 2021-12-09 DIAGNOSIS — Y92410 Unspecified street and highway as the place of occurrence of the external cause: Secondary | ICD-10-CM | POA: Diagnosis not present

## 2021-12-09 DIAGNOSIS — N9489 Other specified conditions associated with female genital organs and menstrual cycle: Secondary | ICD-10-CM | POA: Insufficient documentation

## 2021-12-09 LAB — I-STAT BETA HCG BLOOD, ED (MC, WL, AP ONLY): I-stat hCG, quantitative: <5 m[IU]/mL (ref <5)

## 2021-12-09 MED ORDER — HYDROMORPHONE HCL 1 MG/ML IJ SOLN
0.5000 mg | Freq: Once | INTRAMUSCULAR | Status: AC
  Administered 2021-12-09: 0.5 mg via INTRAVENOUS
  Filled 2021-12-09: qty 1

## 2021-12-09 MED ORDER — IOHEXOL 300 MG/ML  SOLN
100.0000 mL | Freq: Once | INTRAMUSCULAR | Status: AC | PRN
  Administered 2021-12-09: 100 mL via INTRAVENOUS

## 2021-12-09 MED ORDER — ONDANSETRON HCL 4 MG PO TABS: 4.0000 mg | ORAL_TABLET | Freq: Four times a day (QID) | ORAL | 0 refills | Status: AC

## 2021-12-09 MED ORDER — OXYCODONE HCL 5 MG PO TABS: 5.0000 mg | ORAL_TABLET | ORAL | 0 refills | Status: AC | PRN

## 2021-12-09 MED ORDER — MORPHINE SULFATE (PF) 2 MG/ML IV SOLN
4.0000 mg | Freq: Once | INTRAVENOUS | Status: AC
  Administered 2021-12-09: 4 mg via INTRAVENOUS
  Filled 2021-12-09: qty 2

## 2021-12-09 MED ORDER — FENTANYL CITRATE PF 50 MCG/ML IJ SOSY
50.0000 ug | PREFILLED_SYRINGE | Freq: Once | INTRAMUSCULAR | Status: AC
  Administered 2021-12-09: 50 ug via INTRAVENOUS
  Filled 2021-12-09: qty 1

## 2021-12-09 MED ORDER — ONDANSETRON HCL 4 MG/2ML IJ SOLN
4.0000 mg | Freq: Once | INTRAMUSCULAR | Status: AC
  Administered 2021-12-09: 4 mg via INTRAVENOUS
  Filled 2021-12-09: qty 2

## 2021-12-09 MED ORDER — HYDROMORPHONE HCL 1 MG/ML IJ SOLN
0.5000 mg | Freq: Once | INTRAMUSCULAR | Status: DC
  Filled 2021-12-09: qty 1

## 2021-12-09 NOTE — ED Notes (Signed)
Patient transported to CT 

## 2021-12-09 NOTE — Progress Notes (Signed)
Orthopedic Tech Progress Note Patient Details:  Tanya Perez 1985/12/03 [id number] Called order into [employer] for TLSO brace Patient ID: Novella Rob, female   DOB: 04/25/1986, 36 y.o.   MRN: [id number]  Lovett Calender 12/09/2021, 3:24 PM

## 2021-12-09 NOTE — ED Notes (Signed)
RN paged ortho for brace
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
## 2021-12-09 NOTE — Discharge Instructions (Addendum)
Please call [phone] [facility]) to arrange a 2-week outpatient follow-up with neurosurgery.  Please wear your TLSO brace until that time.  For pain control we recommend scheduling Tylenol and Motrin around-the-clock alternating in the following fashion: -Take 1000 mg of Tylenol then 3 hours later take 800 mg of ibuprofen then 3 hours later take 1000 mg of Tylenol then 3 hours later take 800 mg of ibuprofen, continue alternating in that fashion. -If you need additional pain control above and beyond the Tylenol and Motrin, we will send a prescription of oxycodone to your preferred pharmacy ([facility] in [place]).  While you are taking oxycodone, please use a bowel regimen (such as daily MiraLAX) to avoid constipation.  Do not drive while taking narcotic pain medication.  Nausea we will send a prescription for Zofran which she can use as needed for nausea  Return to the ED if you develop difficulty moving your extremities, change in sensation, or reinjury.  Do not lift anything greater than 10 pounds.
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
## 2022-09-24 ENCOUNTER — Ambulatory Visit (HOSPITAL_COMMUNITY): Payer: Self-pay

## 2022-10-28 ENCOUNTER — Other Ambulatory Visit: Payer: Self-pay | Admitting: Obstetrics & Gynecology

## 2022-10-28 DIAGNOSIS — N6310 Unspecified lump in the right breast, unspecified quadrant: Secondary | ICD-10-CM

## 2022-11-07 ENCOUNTER — Other Ambulatory Visit: Payer: Medicaid Other
